# Patient Record
Sex: Male | Born: 1985 | Race: White | Hispanic: No | Marital: Married | Smoking: Never smoker
Health system: Southern US, Community
[De-identification: ages and names within clinical notes are randomized; demographics above are authoritative.]

---
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
## 2007-04-25 ENCOUNTER — Emergency Department (HOSPITAL_COMMUNITY): Admission: EM | Admit: 2007-04-25 | Discharge: 2007-04-25 | Payer: Self-pay | Admitting: Emergency Medicine

## 2011-04-29 NOTE — Op Note (Signed)
NAME:  RADIN, RAPTIS                       ACCOUNT NO.:  [id number]   MEDICAL RECORD NO.:  [id number]                   PATIENT TYPE:  OIB   LOCATION:  2853                                 FACILITY:  [facility]   PHYSICIAN:  Dionne Ano. Everlene Other, M.D.         DATE OF BIRTH:  [birth year]   DATE OF PROCEDURE:  07/22/2003  DATE OF DISCHARGE:                                 OPERATIVE REPORT   PREOPERATIVE DIAGNOSIS:  Displaced right finger proximal phalanx fracture  about the left hand.   POSTOPERATIVE DIAGNOSIS:  Displaced right finger proximal phalanx fracture  about the left hand.   PROCEDURE:  1. Closed reduction and pinning of proximal phalanx fracture of the left     ring finger.  2. Stress radiography,   SURGEON:  Dionne Ano. Amanda Pea, M.D.   ASSISTANT:   COMPLICATIONS:  None.   ANESTHESIA:  General .   TOURNIQUET TIME:  None.   ESTIMATED BLOOD LOSS:  Minimal.   SPECIMENS:  None.   DRAINS:  None.   INDICATIONS FOR PROCEDURE:  The patient is a very pleasant 25 year old male  who presents with the above mentioned diagnosis.  I have counseled him and  his family in regards to the risks and benefits of surgery including the  risks of infection, bleeding, anesthesia, damage to neural structures, and  failure of surgery to accomplish the intended.  With this in mind, they  desired to proceed.  All questions were encouraged and answered  preoperatively.   OPERATIVE FINDINGS:  The patient had displaced proximal phalanx fracture and  underwent closed reduction and pinning without difficulty.  Excellent  alignment under four planes was achieved interoperatively.  I was pleased  with this at the end of the procedure.   PROCEDURE IN DETAIL:  The patient was brought to the operating room,  underwent a smooth induction of general anesthesia, he was then laid supine  and appropriately padded, prepped and draped in the usual sterile fashion  about the left upper extremity with  Betadine scrub and paint.  Once this was  done, the patient had fluoroscopy brought in and a manipulative reduction  done of the proximal phalanx.  I held this reduction with clamps, checked  this in the AP, lateral and oblique planes, and lined this up nicely.  It  looked excellent and was satisfactory.  Following this, I then placed three  0.045 K-wires across the fracture site at right angles/90 degrees to the  fracture line.  This was done to my satisfaction without difficulty.  I then  performed stress radiography revealing excellent stability of the fracture.  I was pleased with this in the findings.  Following this, I bent the pin to  90 degrees, placed Xeroform, dressed the area sterilely.  He was placed in a  plaster splint and tolerated the procedure well.  He was awakened from  anesthesia and taken to the recovery room in good condition.  All sponge and  instrument counts were reported as correct.  We will monitor him closely and  keep the pins in for 3 1/2 to 4 weeks until fracture healing occurs, once  this is done, then we will plan for pin removal.  I have discussed this with  the patient and all questions were encouraged and answered.                                               Dionne Ano. Everlene Other, M.D.    Nash Mantis  D:  07/22/2003  T:  07/22/2003  Job:  [id number]

## 2012-08-08 ENCOUNTER — Other Ambulatory Visit: Payer: Self-pay | Admitting: Sports Medicine

## 2012-08-08 DIAGNOSIS — M25531 Pain in right wrist: Secondary | ICD-10-CM

## 2012-08-10 ENCOUNTER — Ambulatory Visit
Admission: RE | Admit: 2012-08-10 | Discharge: 2012-08-10 | Disposition: A | Discharge status: Home / Self Care | Payer: BC Managed Care – PPO | Source: Ambulatory Visit | Attending: Sports Medicine | Admitting: Sports Medicine

## 2012-08-10 DIAGNOSIS — M25531 Pain in right wrist: Secondary | ICD-10-CM

## 2013-03-08 ENCOUNTER — Other Ambulatory Visit: Payer: Self-pay | Admitting: Orthopedic Surgery

## 2013-03-08 DIAGNOSIS — S62001A Unspecified fracture of navicular [scaphoid] bone of right wrist, initial encounter for closed fracture: Secondary | ICD-10-CM

## 2013-03-13 ENCOUNTER — Ambulatory Visit
Admission: RE | Admit: 2013-03-13 | Discharge: 2013-03-13 | Disposition: A | Discharge status: Home / Self Care | Payer: PRIVATE HEALTH INSURANCE | Source: Ambulatory Visit | Attending: Orthopedic Surgery | Admitting: Orthopedic Surgery

## 2013-03-13 DIAGNOSIS — S62001A Unspecified fracture of navicular [scaphoid] bone of right wrist, initial encounter for closed fracture: Secondary | ICD-10-CM

## 2015-10-19 ENCOUNTER — Emergency Department (HOSPITAL_COMMUNITY)
Admission: EM | Admit: 2015-10-19 | Discharge: 2015-10-19 | Disposition: A | Discharge status: Home / Self Care | Payer: 59 | Source: Home / Self Care

## 2015-10-19 ENCOUNTER — Encounter (HOSPITAL_COMMUNITY): Payer: Self-pay | Admitting: Emergency Medicine

## 2015-10-19 DIAGNOSIS — L600 Ingrowing nail: Secondary | ICD-10-CM | POA: Diagnosis not present

## 2015-10-19 MED ORDER — CEPHALEXIN 500 MG PO CAPS: 500.0000 mg | ORAL_CAPSULE | Freq: Four times a day (QID) | ORAL | Status: AC

## 2015-10-19 MED ORDER — LIDOCAINE HCL 2 % IJ SOLN
INTRAMUSCULAR | Status: AC
  Filled 2015-10-19: qty 20

## 2015-10-19 MED ORDER — BACITRACIN 500 UNIT/GM EX OINT: 1.0000 "application " | TOPICAL_OINTMENT | Freq: Two times a day (BID) | CUTANEOUS | Status: AC

## 2015-10-19 NOTE — ED Notes (Signed)
Pt states he noticed pain in his right great toe about two weeks ago.  Since then the pain has increased.  He thinks he may have clipped his nail to low and now has an ingrown toenail.

## 2015-10-19 NOTE — ED Provider Notes (Signed)
CSN: [id number]     Arrival date & time 10/19/15  1800 History   None    Chief Complaint  Patient presents with  . Toe Pain   (Consider location/radiation/quality/duration/timing/severity/associated sxs/prior Treatment) The history is provided by the patient.    History reviewed. No pertinent past medical history. History reviewed. No pertinent past surgical history. History reviewed. No pertinent family history. Social History  Substance Use Topics  . Smoking status: Never Smoker   . Smokeless tobacco: None  . Alcohol Use: Yes     Comment: occasional    Review of Systems  Constitutional: Negative.   Eyes: Negative.   Respiratory: Negative.   Cardiovascular: Negative.   Gastrointestinal: Negative.   Endocrine: Negative.   Genitourinary: Negative.   Musculoskeletal: Negative.   Skin: Positive for wound.       Right lateral ingrown toenail  Allergic/Immunologic: Negative.   Neurological: Negative.   Hematological: Negative.   Psychiatric/Behavioral: Negative.     Allergies  Review of patient's allergies indicates no known allergies.  Home Medications   Prior to Admission medications   Medication Sig Start Date End Date Taking? Authorizing Provider  cephALEXin (KEFLEX) 500 MG capsule Take 1 capsule (500 mg total) by mouth 4 (four) times daily. 10/19/15   Deatra Canter, FNP   Meds Ordered and Administered this Visit  Medications - No data to display  BP 123/89 mmHg  Pulse 62  Temp(Src) 98 F (36.7 C) (Oral)  Resp 12  SpO2 100% No data found.   Physical Exam  Constitutional: He appears well-developed and well-nourished.  HENT:  Head: Normocephalic and atraumatic.  Eyes: Conjunctivae and EOM are normal. Pupils are equal, round, and reactive to light.  Neck: Normal range of motion. Neck supple.  Cardiovascular: Normal rate, regular rhythm and normal heart sounds.   Pulmonary/Chest: Effort normal and breath sounds normal.  Abdominal: Soft. Bowel sounds  are normal.  Skin: There is erythema.  Lateral edge of right first toenail ingrown and right toe erythematous and tender.    ED Course  .Nail Removal Date/Time: 10/19/2015 8:46 PM Performed by: Deatra Canter Authorized by: Deatra Canter Consent: Verbal consent obtained. Written consent not obtained. Risks and benefits: risks, benefits and alternatives were discussed Consent given by: patient Patient understanding: patient states understanding of the procedure being performed Patient consent: the patient's understanding of the procedure matches consent given Procedure consent: procedure consent matches procedure scheduled Relevant documents: relevant documents present and verified Test results: test results available and properly labeled Site marked: the operative site was marked Imaging studies: imaging studies available Patient identity confirmed: verbally with patient Location: right foot Anesthesia: digital block Local anesthetic: lidocaine 2% without epinephrine Anesthetic total: 8 ml Patient sedated: no Preparation: skin prepped with alcohol and skin prepped with Betadine Amount removed: 1/3 Wedge excision of skin of nail fold: no Nail bed sutured: no Nail matrix removed: partial Dressing: 4x4 Comments: Right great toe prepped with ETOH and betadine and then 4 cc's of lidocaine w/o epi 2% injected each side of toe for digital block. Once adequate anesthesia is obtained the lateral Part of the right toenail is removed and hemostasis is obtained with 4x4 dressing.     (including critical care time)  Labs Review Labs Reviewed - No data to display  Imaging Review No results found.   Visual Acuity Review  Right Eye Distance:   Left Eye Distance:   Bilateral Distance:    Right Eye Near:   Left Eye  Near:    Bilateral Near:         MDM   1. Ingrown toenail    Right first toenail lateral toenail removed (partial toenail removal) Ortho post op  shoe Get Neosporin ointment  Deatra CanterWilliam J Oxford FNP  Deatra CanterWilliam J Oxford, FNP 10/19/15 2050

## 2017-02-16 DIAGNOSIS — T1502XA Foreign body in cornea, left eye, initial encounter: Secondary | ICD-10-CM | POA: Diagnosis not present

## 2017-02-24 DIAGNOSIS — T1502XA Foreign body in cornea, left eye, initial encounter: Secondary | ICD-10-CM | POA: Diagnosis not present

## 2018-05-23 ENCOUNTER — Ambulatory Visit: Payer: Self-pay | Admitting: Family

## 2018-05-23 ENCOUNTER — Encounter: Payer: Self-pay | Admitting: Family

## 2018-05-23 VITALS — BP 120/70 | HR 69 | Temp 98.9°F | Ht 71.0 in | Wt 208.6 lb

## 2018-05-23 DIAGNOSIS — Z0000 Encounter for general adult medical examination without abnormal findings: Secondary | ICD-10-CM

## 2018-05-23 NOTE — Patient Instructions (Signed)
Exercising to Stay Healthy Exercising regularly is important. It has many health benefits, such as:  Improving your overall fitness, flexibility, and endurance.  Increasing your bone density.  Helping with weight control.  Decreasing your body fat.  Increasing your muscle strength.  Reducing stress and tension.  Improving your overall health.  In order to become healthy and stay healthy, it is recommended that you do moderate-intensity and vigorous-intensity exercise. You can tell that you are exercising at a moderate intensity if you have a higher heart rate and faster breathing, but you are still able to hold a conversation. You can tell that you are exercising at a vigorous intensity if you are breathing much harder and faster and cannot hold a conversation while exercising. How often should I exercise? Choose an activity that you enjoy and set realistic goals. Your health care provider can help you to make an activity plan that works for you. Exercise regularly as directed by your health care provider. This may include:  Doing resistance training twice each week, such as: ? Push-ups. ? Sit-ups. ? Lifting weights. ? Using resistance bands.  Doing a given intensity of exercise for a given amount of time. Choose from these options: ? 150 minutes of moderate-intensity exercise every week. ? 75 minutes of vigorous-intensity exercise every week. ? A mix of moderate-intensity and vigorous-intensity exercise every week.  Children, pregnant women, people who are out of shape, people who are overweight, and older adults may need to consult a health care provider for individual recommendations. If you have any sort of medical condition, be sure to consult your health care provider before starting a new exercise program. What are some exercise ideas? Some moderate-intensity exercise ideas include:  Walking at a rate of 1 mile in 15  minutes.  Biking.  Hiking.  Golfing.  Dancing.  Some vigorous-intensity exercise ideas include:  Walking at a rate of at least 4.5 miles per hour.  Jogging or running at a rate of 5 miles per hour.  Biking at a rate of at least 10 miles per hour.  Lap swimming.  Roller-skating or in-line skating.  Cross-country skiing.  Vigorous competitive sports, such as football, basketball, and soccer.  Jumping rope.  Aerobic dancing.  What are some everyday activities that can help me to get exercise?  Yard work, such as: ? Pushing a lawn mower. ? Raking and bagging leaves.  Washing and waxing your car.  Pushing a stroller.  Shoveling snow.  Gardening.  Washing windows or floors. How can I be more active in my day-to-day activities?  Use the stairs instead of the elevator.  Take a walk during your lunch break.  If you drive, park your car farther away from work or school.  If you take public transportation, get off one stop early and walk the rest of the way.  Make all of your phone calls while standing up and walking around.  Get up, stretch, and walk around every 30 minutes throughout the day. What guidelines should I follow while exercising?  Do not exercise so much that you hurt yourself, feel dizzy, or get very short of breath.  Consult your health care provider before starting a new exercise program.  Wear comfortable clothes and shoes with good support.  Drink plenty of water while you exercise to prevent dehydration or heat stroke. Body water is lost during exercise and must be replaced.  Work out until you breathe faster and your heart beats faster. This information is not   intended to replace advice given to you by your health care provider. Make sure you discuss any questions you have with your health care provider. Document Released: 12/31/2010 Document Revised: 05/05/2016 Document Reviewed: 05/01/2014 Elsevier Interactive Patient Education  2018  [employer]vier Inc[employer]  

## 2018-05-23 NOTE — Progress Notes (Signed)
Subjective:     Patient ID: Mitchell Hansen, male   DOB: 10-19-1986, 32 y.o.   MRN: [id number]  HPI 32 year old male is in today for CPX for insurance purposes. He is married. No health concerns. Declines labs  Review of Systems  All other systems reviewed and are negative.  No past medical history on file.  Social History   Socioeconomic History  . Marital status: Single    Spouse name: Not on file  . Number of children: Not on file  . Years of education: Not on file  . Highest education level: Not on file  Occupational History  . Not on file  Social Needs  . Financial resource strain: Not on file  . Food insecurity:    Worry: Not on file    Inability: Not on file  . Transportation needs:    Medical: Not on file    Non-medical: Not on file  Tobacco Use  . Smoking status: Never Smoker  . Smokeless tobacco: Never Used  Substance and Sexual Activity  . Alcohol use: Yes    Comment: occasional  . Drug use: No  . Sexual activity: Not on file  Lifestyle  . Physical activity:    Days per week: Not on file    Minutes per session: Not on file  . Stress: Not on file  Relationships  . Social connections:    Talks on phone: Not on file    Gets together: Not on file    Attends religious service: Not on file    Active member of club or organization: Not on file    Attends meetings of clubs or organizations: Not on file    Relationship status: Not on file  . Intimate partner violence:    Fear of current or ex partner: Not on file    Emotionally abused: Not on file    Physically abused: Not on file    Forced sexual activity: Not on file  Other Topics Concern  . Not on file  Social History Narrative  . Not on file    No past surgical history on file.  No family history on file.  No Known Allergies  Current Outpatient Medications on File Prior to Visit  Medication Sig Dispense Refill  . bacitracin 500 UNIT/GM ointment Apply 1 application topically 2 (two) times  daily. 28 g 1  . cephALEXin (KEFLEX) 500 MG capsule Take 1 capsule (500 mg total) by mouth 4 (four) times daily. 40 capsule 0   No current facility-administered medications on file prior to visit.     BP 120/70   Pulse 69   Temp 98.9 F (37.2 C)   Ht 5\' 11"  (1.803 m)   Wt 208 lb 9.6 oz (94.6 kg)   SpO2 98%   BMI 29.09 kg/m chart    Objective:   Physical Exam  Constitutional: He is oriented to person, place, and time. He appears well-developed and well-nourished.  HENT:  Head: Normocephalic and atraumatic.  Right Ear: External ear normal.  Left Ear: External ear normal.  Mouth/Throat: Oropharynx is clear and moist.  Eyes: Pupils are equal, round, and reactive to light. Conjunctivae and EOM are normal.  Neck: Normal range of motion. Neck supple.  Cardiovascular: Normal rate, regular rhythm and normal heart sounds.  Pulmonary/Chest: Effort normal and breath sounds normal.  Abdominal: Soft. Bowel sounds are normal.  Musculoskeletal: Normal range of motion.  Neurological: He is alert and oriented to person, place, and time.  Skin: Skin is warm and dry.  Psychiatric: He has a normal mood and affect.       Assessment:     Mitchell Hansen was seen today for cpe.  Diagnoses and all orders for this visit:  Routine health maintenance      Plan:     See PCP as scheduled

## 2018-10-28 ENCOUNTER — Emergency Department (HOSPITAL_COMMUNITY): Payer: 59

## 2018-10-28 ENCOUNTER — Encounter (HOSPITAL_COMMUNITY): Payer: Self-pay

## 2018-10-28 ENCOUNTER — Emergency Department (HOSPITAL_COMMUNITY)
Admission: EM | Admit: 2018-10-28 | Discharge: 2018-10-28 | Disposition: A | Discharge status: Home / Self Care | Payer: 59 | Attending: Emergency Medicine | Admitting: Emergency Medicine

## 2018-10-28 ENCOUNTER — Other Ambulatory Visit: Payer: Self-pay

## 2018-10-28 DIAGNOSIS — S93491A Sprain of other ligament of right ankle, initial encounter: Secondary | ICD-10-CM | POA: Diagnosis not present

## 2018-10-28 DIAGNOSIS — M25571 Pain in right ankle and joints of right foot: Secondary | ICD-10-CM | POA: Diagnosis not present

## 2018-10-28 DIAGNOSIS — W2189XA Striking against or struck by other sports equipment, initial encounter: Secondary | ICD-10-CM | POA: Diagnosis not present

## 2018-10-28 DIAGNOSIS — Y92320 Baseball field as the place of occurrence of the external cause: Secondary | ICD-10-CM | POA: Diagnosis not present

## 2018-10-28 DIAGNOSIS — Y992 Volunteer activity: Secondary | ICD-10-CM | POA: Insufficient documentation

## 2018-10-28 DIAGNOSIS — Y9364 Activity, baseball: Secondary | ICD-10-CM | POA: Insufficient documentation

## 2018-10-28 DIAGNOSIS — S99911A Unspecified injury of right ankle, initial encounter: Secondary | ICD-10-CM | POA: Diagnosis not present

## 2018-10-28 DIAGNOSIS — M7989 Other specified soft tissue disorders: Secondary | ICD-10-CM | POA: Diagnosis not present

## 2018-10-28 NOTE — ED Triage Notes (Signed)
Pt states while playing ball today he went into first base with his foot getting stuck on the base and turning under him.

## 2018-10-28 NOTE — ED Provider Notes (Signed)
[facility]ENC[facility] Provider Note   CSN: [id number] Arrival date & time: 10/28/18  1425     History   Chief Complaint Chief Complaint  Patient presents with  . Ankle Pain    HPI Mitchell Jarvisimothy R Stryker is a 32 y.o. male.  The history is provided by the patient. No language interpreter was used.  Ankle Pain     Mitchell Hansen is a 32 y.o. male who presents to the Emergency Department complaining of ankle pain. Resents to the emergency department complaining of ankle pain. He was playing softball when he was sliding into first base had an inversion injury to his right ankle. He experienced immediate pain and swelling. He placed ice and took ibuprofen and presents for evaluation. Pain is located over the lateral aspect of the right ankle. No prior similar symptoms. He has no medical problems and takes no medications. History reviewed. No pertinent past medical history.  There are no active problems to display for this patient.   History reviewed. No pertinent surgical history.      Home Medications    Prior to Admission medications   Medication Sig Start Date End Date Taking? Authorizing Provider  bacitracin 500 UNIT/GM ointment Apply 1 application topically 2 (two) times daily. 10/19/15   Deatra Canterxford, William J, FNP  cephALEXin (KEFLEX) 500 MG capsule Take 1 capsule (500 mg total) by mouth 4 (four) times daily. 10/19/15   Deatra Canterxford, William J, FNP    Family History No family history on file.  Social History Social History   Tobacco Use  . Smoking status: Never Smoker  . Smokeless tobacco: Never Used  Substance Use Topics  . Alcohol use: Yes    Comment: occasional  . Drug use: No     Allergies   Patient has no known allergies.   Review of Systems Review of Systems  All other systems reviewed and are negative.    Physical Exam Updated Vital Signs BP 126/86 (BP Location: Right Arm)   Pulse 80   Temp 98.4 F (36.9 C) (Oral)   Resp  17   Ht 5\' 11"  (1.803 m)   Wt 93 kg   SpO2 100%   BMI 28.59 kg/m   Physical Exam  Constitutional: He is oriented to person, place, and time. He appears well-developed and well-nourished. No distress.  HENT:  Head: Normocephalic and atraumatic.  Cardiovascular: Normal rate.  Pulmonary/Chest: Effort normal. No respiratory distress.  Musculoskeletal:  2+ DP pulses bilaterally. There is moderate edema to the right ankle, greatest over the lateral malleolus. There is no tenderness over the fibular head. There is mild tenderness over the right anterior and lateral ankle. Flexion extension intact ankle. Sensation intact throughout the foot.  Neurological: He is alert and oriented to person, place, and time.  Skin: Skin is warm and dry. Capillary refill takes less than 2 seconds.  Psychiatric: He has a normal mood and affect. His behavior is normal.  Nursing note and vitals reviewed.    ED Treatments / Results  Labs (all labs ordered are listed, but only abnormal results are displayed) Labs Reviewed - No data to display  EKG None  Radiology No results found.  Procedures Procedures (including critical care time)  Medications Ordered in ED Medications - No data to display   Initial Impression / Assessment and Plan / ED Course  I have reviewed the triage vital signs and the nursing notes.  Pertinent labs & imaging results that were available during  my care of the patient were reviewed by me and considered in my medical decision making (see chart for details).     Here for evaluation of ankle pain after an inversion injury that occurred just prior to ED arrival. He has significant edema on examination. Plain films are negative for acute fracture. Counseled patient on home care for ankle sprain. Discussed outpatient follow-up.  Final Clinical Impressions(s) / ED Diagnoses   Final diagnoses:  None    ED Discharge Orders    None       Tilden Fossa, MD 10/28/18  1558

## 2018-10-28 NOTE — ED Notes (Signed)
Pt transported to XR.  

## 2018-10-31 DIAGNOSIS — M79671 Pain in right foot: Secondary | ICD-10-CM | POA: Diagnosis not present

## 2018-10-31 DIAGNOSIS — M25571 Pain in right ankle and joints of right foot: Secondary | ICD-10-CM | POA: Diagnosis not present

## 2018-10-31 MED FILL — HYDROCODON-APAP 5-325: 5-325 | 5 days supply | Qty: 20 | Fill #0

## 2018-11-01 DIAGNOSIS — M25571 Pain in right ankle and joints of right foot: Secondary | ICD-10-CM | POA: Diagnosis not present

## 2018-11-06 DIAGNOSIS — M25571 Pain in right ankle and joints of right foot: Secondary | ICD-10-CM | POA: Diagnosis not present

## 2018-11-06 DIAGNOSIS — M79671 Pain in right foot: Secondary | ICD-10-CM | POA: Diagnosis not present

## 2018-11-13 DIAGNOSIS — S93314D Dislocation of tarsal joint of right foot, subsequent encounter: Secondary | ICD-10-CM | POA: Diagnosis not present

## 2018-11-13 DIAGNOSIS — M79671 Pain in right foot: Secondary | ICD-10-CM | POA: Diagnosis not present

## 2018-11-13 DIAGNOSIS — M25571 Pain in right ankle and joints of right foot: Secondary | ICD-10-CM | POA: Diagnosis not present

## 2018-12-03 DIAGNOSIS — S93314D Dislocation of tarsal joint of right foot, subsequent encounter: Secondary | ICD-10-CM | POA: Diagnosis not present

## 2018-12-11 ENCOUNTER — Ambulatory Visit: Payer: 59 | Attending: Sports Medicine | Admitting: Physical Therapy

## 2018-12-11 ENCOUNTER — Encounter: Payer: Self-pay | Admitting: Physical Therapy

## 2018-12-11 DIAGNOSIS — M25671 Stiffness of right ankle, not elsewhere classified: Secondary | ICD-10-CM | POA: Insufficient documentation

## 2018-12-11 DIAGNOSIS — M6281 Muscle weakness (generalized): Secondary | ICD-10-CM | POA: Diagnosis not present

## 2018-12-11 DIAGNOSIS — R2689 Other abnormalities of gait and mobility: Secondary | ICD-10-CM | POA: Diagnosis not present

## 2018-12-11 DIAGNOSIS — M25571 Pain in right ankle and joints of right foot: Secondary | ICD-10-CM | POA: Diagnosis not present

## 2018-12-11 NOTE — Patient Instructions (Addendum)
ROM: Inversion / Eversion   With left leg relaxed, gently turn ankle and foot in and out. Move through full range of motion. Avoid pain. Repeat _15___ times per set. Do __1-2__ sets per session. Do __3__ sessions per day.  http://orth.exer.us/36   Copyright  VHI. All rights reserved.  ROM: Plantar / Dorsiflexion   With left leg relaxed, gently flex and extend ankle. Move through full range of motion. Avoid pain. Repeat _15___ times per set. Do ___1-2_ sets per session. Do ____ sessions per day.  http://orth.exer.us/34   Copyright  VHI. All rights reserved.  Ankle Alphabet   Using left ankle and foot only, trace the letters of the alphabet. Perform A to Z. Repeat _3___ times per set. Do __1__ sets per session. Do _1-2___ sessions per day.  http://orth.exer.us/16   Copyright  VHI. All rights reserved.  Ankle Circles   Slowly rotate right foot and ankle clockwise then counterclockwise. Gradually increase range of motion. Avoid pain. Circle _15___ times each direction per set. Do __1-2__ sets per session. Do _3___ sessions per day.  http://orth.exer.us/30   Copyright  VHI. All rights reserved.  Gastroc / Heel Cord Stretch - Seated With Towel   Sit on floor, towel around ball of foot. Gently pull foot in toward body, stretching heel cord and calf. Hold for ___ seconds. Repeat on involved leg. Repeat ___ times. Do ___ times per day.   Forefoot Evertors   Place right foot flat on towel, knee pointed forward. Use forefoot and toes to push towel out to side. Do not allow heel or knee to move. Hold _3___ seconds. Repeat _3___ times. Do _1___ sessions per day. CAUTION: Repetitions should be slow and controlled.  Forefoot Invertors   Place right foot flat on towel, knee pointed forward. Use forefoot and toes to pull towel in toward center. Do not allow heel or knee to move. Hold __3__ seconds. Repeat _3___ times. Do __1__ sessions per day. CAUTION: Repetitions should  be slow and controlled.   TOES: Towel Bunching   With involved straight toes on towel, bend toes bunching up towel ___ times. Do ___ times per day.  Copyright  VHI. All rights reserved.    RICE Therapy for Routine Care of Injuries The routine care of many injuries includes rest, ice, compression, and elevation (RICE therapy). RICE therapy is often recommended for injuries to soft tissues, such as muscle strain, sprains, bruises, and overuse injuries. It can also be used for some bone injuries. Using RICE therapy can help to relieve pain and lessen swelling. Supplies needed:  Ice.  Plastic bag.  Towel.  Elastic bandage.  Pillow or pillows to raise (elevate) the injured body part. How to care for your injury with RICE therapy  Rest Rest your injury. This may help with the healing process. Rest usually involves limiting your normal activities and not using the injured part of your body. Generally, you can return to your normal activities when your health care provider says it is okay and you can do them without much discomfort. If you rest the injury too much, it may not heal as well. Some injuries heal better with early movement instead of resting for too long. Talk with your health care provider about how you should limit your activities and whether you should start range-of-motion exercises for your injury. Ice Ice your injury to lessen swelling and pain. Do not apply ice directly to your skin.  Put ice in a plastic bag.  Place a towel  between your skin and the bag.  Leave the ice on for 20 minutes, 2-3 times a day. Use ice on as many days as told by your health care provider. Compression Put pressure (compression) on your injured area to control swelling, give support, and help with discomfort. Compression may be done with an elastic bandage. If an elastic bandage has been applied, follow these general tips:  Use the bandage as directed by the maker of the bandage that you  are using.  Do notwrap the bandage too tightly. That may block (cut off) circulation in the arm or leg in the area below the bandage. ? If part of your body beyond the bandage becomes blue, numb, cold, swollen, or more painful, your bandage is probably too tight. If this occurs, remove your bandage and reapply it more loosely.  Remove and reapply the bandage every 3-4 hours or as told by your health care provider.  See your health care provider if the bandage seems to be making your problems worse rather than better. Elevation Elevate your injured area to lessen swelling and pain. If possible, elevate your injured area at or above the level of your heart or the center of your chest. Contact a health care provider if:  Your pain and swelling continue.  Your symptoms are getting worse rather than improving. Having these problems may mean that you need further evaluation or imaging tests, such as X-rays or an MRI. Sometimes, X-rays may not show a small broken bone (fracture) until days after the injury happened. Make a follow-up appointment with your health care provider. Ask your health care provider, or the department that is doing the imaging test, when your results will be ready. Get help right away if:  You have sudden severe pain at or below the area of your injury.  You have redness or increased swelling around your injury.  You have tingling or numbness at or below the area of your injury and it does not improve after you remove the elastic bandage. Summary  The routine care of many injuries includes rest, ice, compression, and elevation (RICE therapy). Using RICE therapy can help to relieve pain and lessen swelling.  RICE therapy is often recommended for injuries to soft tissues, such as muscle strain, sprains, bruises, and overuse injuries. It can also be used for some bone injuries.  Seek medical care if your pain and swelling continue or if your symptoms are getting worse  rather than improving. This information is not intended to replace advice given to you by your health care provider. Make sure you discuss any questions you have with your health care provider. Document Released: 03/12/2001 Document Revised: 08/18/2017 Document Reviewed: 08/18/2017 Elsevier Interactive Patient Education  2019 Elsevier Inc.  Mitchell Hansen, PT Certified Exercise Expert for the Aging Adult  12/11/18 11:13 AM Phone: [phone] Fax: [phone]

## 2018-12-11 NOTE — Therapy (Signed)
[facility] [address] [place], [place], [address] Phone: [phone]   Fax:  [phone]  Physical Therapy Evaluation  Patient Details  Name: Mitchell Hansen MRN: [id number] Date of Birth: [birth year] Referring Provider (PT): Rodolph Bong MD   Encounter Date: 12/11/2018  PT End of Session - 12/11/18 1131    Visit Number  1    Number of Visits  13    Date for PT Re-Evaluation  01/22/19    Authorization Type  [facility]    PT Start Time  1102    PT Stop Time  1145    PT Time Calculation (min)  43 min    Activity Tolerance  Patient tolerated treatment well    Behavior During Therapy  [facility] for tasks assessed/performed       History reviewed. No pertinent past medical history.  History reviewed. No pertinent surgical history.  There were no vitals filed for this visit.   Subjective Assessment - 12/11/18 1101    Subjective  I was involved in an accident playing a softball tournament and I inverted my ankle.  I dislocated my subtalar jt. says the MD . Pt reset himself on the field    How long can you sit comfortably?  unlimited     How long can you stand comfortably?  1 hour then pain    How long can you walk comfortably?  1 hour    Currently in Pain?  Yes    Pain Score  1    can get up to 3/10   Pain Orientation  Right    Pain Descriptors / Indicators  Aching;Tightness;Sore    Pain Type  Acute pain    Pain Onset  1 to 4 weeks ago    Pain Frequency  Intermittent    Aggravating Factors   walking on foot especially without boot         [facility] PT Assessment - 12/11/18 1124      Assessment   Medical Diagnosis  right sub talar dislocation with ligament tears    Referring Provider (PT)  Rodolph Bong MD    Onset Date/Surgical Date  10/28/18    Hand Dominance  Right    Next MD Visit  not scheduled    Prior Therapy  none      Precautions   Precautions  --   ankle    Precaution Comments  wearing walking boot for protection       Restrictions   Weight Bearing Restrictions  Yes    RLE Weight Bearing  Weight bearing as tolerated      Balance Screen   Has the patient fallen in the past 6 months  Yes    How many times?  --   only once at softball games   Has the patient had a decrease in activity level because of a fear of falling?   No    Is the patient reluctant to leave their home because of a fear of falling?   No      Home Nurse, mental health  Private residence    Living Arrangements  Spouse/significant other;Children    Home Access  Stairs to enter    Entrance Stairs-Number of Steps  2    Entrance Stairs-Rails  None    Home Layout  Two level      Prior Function   Level of Independence  Independent      Cognition   Overall Cognitive Status  Within Functional Limits for tasks assessed      Observation/Other Assessments   Focus on Therapeutic Outcomes (FOTO)   FOTO intake 41% limittaion 59% predicted 32%       Figure 8 Edema   Figure 8 - Right   61.0 cm    Figure 8 - Left   57.5      ROM / Strength   AROM / PROM / Strength  AROM;Strength      AROM   Overall AROM   Deficits    Right Ankle Dorsiflexion  -4    Right Ankle Plantar Flexion  40    Right Ankle Inversion  16   tightness/pain   Right Ankle Eversion  10    Left Ankle Dorsiflexion  12    Left Ankle Plantar Flexion  58    Left Ankle Inversion  31    Left Ankle Eversion  35      Strength   Overall Strength  Deficits    Right Knee Flexion  5/5    Right Knee Extension  5/5    Left Knee Flexion  5/5    Left Knee Extension  5/5    Right Ankle Dorsiflexion  3-/5    Right Ankle Plantar Flexion  3-/5    Right Ankle Inversion  3-/5    Right Ankle Eversion  3-/5    Left Ankle Dorsiflexion  5/5    Left Ankle Plantar Flexion  5/5    Left Ankle Inversion  5/5    Left Ankle Eversion  5/5      Palpation   Palpation comment  tenderness over anterior ankle. over ant, talofibular lig    moderate swelling      Ambulation/Gait   Gait Pattern  Step-to pattern    Ambulation Surface  Level    Gait Comments  Pt with wt on right foot /decreased stance time on left foot and limping with tennis shoe on.     Pt told to wear protective boot               Objective measurements completed on examination: See above findings.      [facility] Adult PT Treatment/Exercise - 12/11/18 1124      Self-Care   Self-Care  Other Self-Care Comments    Other Self-Care Comments   educated on PRICE and use of boot for protection      Vasopneumatic   Number Minutes Vasopneumatic   15 minutes    Vasopnuematic Location   Ankle   right   Vasopneumatic Pressure  Medium    Vasopneumatic Temperature   32 degrees      Ankle Exercises: Stretches   Other Stretch  DF towel stretch on right 30 sec x 3      Ankle Exercises: Supine   Other Supine Ankle Exercises  AROM in DF, EV, IN and PF , ankle circles and x 10 each    Other Supine Ankle Exercises  towel scrunch for IN and EV and toe flexion/PF as able with length of towel x 2 each to reinforce             PT Education - 12/11/18 1117    Education Details  POC Explanation  initial HEP and RICE/edema control    Person(s) Educated  Patient    Methods  Explanation;Demonstration;Tactile cues;Verbal cues;Handout    Comprehension  Verbalized understanding;Returned demonstration       PT Short Term Goals - 12/11/18 1123  PT SHORT TERM GOAL #1   Title  Pt will be independent  with initial HEP (12-25-18)    Time  2    Period  Weeks    Status  New    Target Date  12/25/18      PT SHORT TERM GOAL #2   Title  "Demonstrate and verbalize understanding of condition management including RICE, positioning, use of A.D., HEP to reduce pain and edema( 12-25-18    Time  2    Period  Weeks    Status  New    Target Date  12/25/18        PT Long Term Goals - 12/11/18 1138      PT LONG TERM GOAL #1   Title  "Pt will be independent with advanced HEP.     Time  6     Period  Weeks    Status  New    Target Date  01/22/19      PT LONG TERM GOAL #2   Title  "FOTO will improve from 59% limitation   to 32%    indicating improved functional mobility .     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    Time  6    Period  Weeks    Status  New    Target Date  01/22/19      PT LONG TERM GOAL #4   Title  Pt will be able to negotiate steps without exacerbating pain     Time  6    Period  Weeks    Status  New    Target Date  01/22/19      PT LONG TERM GOAL #5   Title  "Pt will improve her R ankle DF toat least 10 degrees  for a more functional and efficient gait pattern    Time  6    Period  Weeks    Status  New    Target Date  01/22/19             Plan - 12/11/18 1338    Clinical Impression Statement  32 yo male playing softball tournament and had inversion sprain/ and subtalar dislocation of right foot in which pt reset himself on the field. Pt with impairments in ambulation, edema, AROM and weakness in which skilled PT can address.  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  Pt was wearing super flexible tennis shoe and was told to use walking boot as advised by MD.  Pt would like to return to [employer] in order to to his job    Clinical Presentation  Stable    Clinical Decision Making  Low    Rehab Potential  Excellent    PT Frequency  2x / week    PT Duration  6 weeks    PT Treatment/Interventions  ADLs/Self Care Home Management;Cryotherapy;Electrical Stimulation;Iontophoresis 4mg /ml Dexamethasone;Moist Heat;Ultrasound;Contrast Bath;Therapeutic exercise;Therapeutic activities;Functional mobility training;Gait training;Stair training;Neuromuscular re-education;Patient/family education;Manual techniques;Passive range  of motion;Taping;Dry needling;Vasopneumatic Device;Joint Manipulations    PT Next Visit Plan  assess edema, Add T band strengthening as able    PT Home  Exercise Plan  AROM ankle, towel exericises    Consulted and Agree with Plan of Care  Patient       Patient will benefit from skilled therapeutic intervention in order to improve the following deficits and impairments:  Abnormal gait, Decreased activity tolerance, Decreased mobility, Decreased range of motion, Difficulty walking, Decreased strength, Increased edema, Impaired flexibility, Increased muscle spasms, Pain  Visit Diagnosis: Pain in right ankle and joints of right foot  Stiffness of right ankle, not elsewhere classified  Muscle weakness (generalized)  Other abnormalities of gait and mobility     Problem List There are no active problems to display for this patient.  Lawrie Beardsley, PT Certified Exercise Expert for the Aging Adult  12/11/18 1:43 PM Phone: [phone] Fax: [phone]  [facility] [address] [place], [place], [address] Phone: [phone]   Fax:  [phone]  Name: Isabell Jarvisimothy R Babic MRN: [id number] Date of Birth: July 25, 1986

## 2018-12-18 ENCOUNTER — Encounter: Payer: Self-pay | Admitting: Physical Therapy

## 2018-12-18 ENCOUNTER — Ambulatory Visit: Payer: 59 | Attending: Sports Medicine | Admitting: Physical Therapy

## 2018-12-18 DIAGNOSIS — R2689 Other abnormalities of gait and mobility: Secondary | ICD-10-CM | POA: Diagnosis not present

## 2018-12-18 DIAGNOSIS — M25571 Pain in right ankle and joints of right foot: Secondary | ICD-10-CM | POA: Diagnosis not present

## 2018-12-18 DIAGNOSIS — M25671 Stiffness of right ankle, not elsewhere classified: Secondary | ICD-10-CM | POA: Insufficient documentation

## 2018-12-18 DIAGNOSIS — M6281 Muscle weakness (generalized): Secondary | ICD-10-CM | POA: Diagnosis not present

## 2018-12-18 NOTE — Therapy (Signed)
[facility] [address] [place], [place], [address] Phone: [phone]   Fax:  [phone]  Physical Therapy Treatment  Patient Details  Name: Mitchell Jarvisimothy R Hansen MRN: [id number] Date of Birth: November 20, 1986 Referring Provider (PT): Rodolph BongKendall Adam MD   Encounter Date: 12/18/2018  PT End of Session - 12/18/18 1534    Visit Number  2    Number of Visits  13    PT Start Time  0300    PT Stop Time  0349    PT Time Calculation (min)  49 min    Activity Tolerance  Patient tolerated treatment well    Behavior During Therapy  [facility] for tasks assessed/performed       History reviewed. No pertinent past medical history.  History reviewed. No pertinent surgical history.  There were no vitals filed for this visit.  Subjective Assessment - 12/18/18 1503    Subjective  Pt has no complaints of pain while in the boot, but when doing exercises has pain in lateral and posterior aspect of right ankle.    Currently in Pain?  No/denies    Aggravating Factors   Exercise with boot off.         [facility] - 12/18/18 0001      Figure 8 Edema   Figure 8 - Right   60.5cm    59 cm after vaso                  [facility] Adult PT Treatment/Exercise - 12/18/18 0001      Vasopneumatic   Number Minutes Vasopneumatic   15 minutes    Vasopnuematic Location   Ankle    Vasopneumatic Pressure  Medium    Vasopneumatic Temperature   34      Ankle Exercises: Stretches   Other Stretch  DF towel stretch on right 30 sec x 3      Ankle Exercises: Seated   Towel Crunch Limitations  20 reps    Towel Inversion/Eversion  --   10 reps each side   Towel Inversion/Eversion Limitations  Red Theraband    Toe Raise  15 reps    BAPS Limitations  No weight x2 min    Other Seated Ankle Exercises  DF and PF with red theraband      Ankle Exercises: Supine   Other Supine Ankle Exercises  --    Other Supine Ankle Exercises  --   Added alternating toe  lifts.            PT Education - 12/18/18 1546    Education Details  HEP    Person(s) Educated  Patient    Methods  Explanation;Handout    Comprehension  Verbalized understanding;Returned demonstration       PT Short Term Goals - 12/11/18 1123      PT SHORT TERM GOAL #1   Title  Pt will be independent  with initial HEP (12-25-18)    Time  2    Period  Weeks    Status  New    Target Date  12/25/18      PT SHORT TERM GOAL #2   Title  "Demonstrate and verbalize understanding of condition management including RICE, positioning, use of A.D., HEP to reduce pain and edema( 12-25-18    Time  2    Period  Weeks    Status  New    Target Date  12/25/18        PT Long Term Goals -  12/11/18 1138      PT LONG TERM GOAL #1   Title  "Pt will be independent with advanced HEP.     Time  6    Period  Weeks    Status  New    Target Date  01/22/19      PT LONG TERM GOAL #2   Title  "FOTO will improve from 59% limitation   to 32%    indicating improved functional mobility .     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 Added red theraband to 4 way ankle exercises. Pt shows improvement with DF ROM and is able to flex past neutral. Pt stated that  vasocryotherapy improved edema during last treatment and was continued for this treatment to reduce edema. Circumferential measurements improved by 1.5 cm in comparison to last treatment. Therapist provided cues during AROM ankle for proper alignment.     PT Frequency  2x / week    PT Duration  6 weeks    PT Treatment/Interventions  ADLs/Self Care Home Management;Cryotherapy;Electrical Stimulation;Iontophoresis 4mg /ml Dexamethasone;Moist Heat;Ultrasound;Contrast Bath;Therapeutic exercise;Therapeutic activities;Functional mobility training;Gait training;Stair training;Neuromuscular re-education;Patient/family education;Manual techniques;Passive range of motion;Taping;Dry needling;Vasopneumatic Device;Joint Manipulations    PT Next Visit Plan  Therapist instructed pt to bring shoe to next visit to initiate standing exercises.     PT Home Exercise Plan  AROM ankle with TB, towel exericises, toe raises, ankle 4 way with red theraband    Consulted and Agree with Plan of Care  Patient       During this treatment session, the therapist was present, participating in and directing the treatment.  Patient will benefit from skilled therapeutic intervention in order to improve the following deficits and impairments:  Abnormal gait, Decreased activity tolerance, Decreased mobility, Decreased range of motion, Difficulty walking, Decreased strength, Increased edema, Impaired flexibility, Increased muscle spasms, Pain  Visit Diagnosis: Pain in right ankle and joints of right foot  Stiffness of right ankle, not elsewhere classified  Muscle weakness (generalized)  Other abnormalities of gait and mobility     Problem List There are no active problems to display for this patient.   Royetta Asal, SPTA 12/18/2018, 4:04 PM   Jannette Spanner, PTA 12/18/18 4:06 PM Phone: [phone] Fax:  [phone]  [facility] [address] [place], [place],  [address] Phone: [phone]   Fax:  [phone]  Name: Mitchell Hansen MRN: [id number] Date of Birth: [birth year]

## 2018-12-18 NOTE — Patient Instructions (Signed)
Pt given 4 way ankle with red theraband 2 sets of 20 reps 2 times per day

## 2018-12-20 ENCOUNTER — Encounter: Payer: Self-pay | Admitting: Physical Therapy

## 2018-12-20 ENCOUNTER — Ambulatory Visit: Payer: 59 | Admitting: Physical Therapy

## 2018-12-20 DIAGNOSIS — M25671 Stiffness of right ankle, not elsewhere classified: Secondary | ICD-10-CM

## 2018-12-20 DIAGNOSIS — R2689 Other abnormalities of gait and mobility: Secondary | ICD-10-CM

## 2018-12-20 DIAGNOSIS — M25571 Pain in right ankle and joints of right foot: Secondary | ICD-10-CM

## 2018-12-20 DIAGNOSIS — M6281 Muscle weakness (generalized): Secondary | ICD-10-CM

## 2018-12-20 NOTE — Therapy (Signed)
[facility] [address] [place], [place], [address] Phone: [phone]   Fax:  [phone]  Physical Therapy Treatment  Patient Details  Name: Mitchell Hansen MRN: [id number] Date of Birth: [birth year] Referring Provider (PT): Rodolph BongKendall Adam MD   Encounter Date: 12/20/2018  PT End of Session - 12/20/18 0932    Visit Number  3    Number of Visits  13    Date for PT Re-Evaluation  01/22/19    Authorization Type  UMR    PT Start Time  0930    PT Stop Time  1015    PT Time Calculation (min)  45 min       History reviewed. No pertinent past medical history.  History reviewed. No pertinent surgical history.  There were no vitals filed for this visit.      [facility] - 12/20/18 0001      AROM   Right Ankle Dorsiflexion  0    Right Ankle Plantar Flexion  52    Right Ankle Inversion  18    Right Ankle Eversion  14      Strength   Right Ankle Dorsiflexion  4+/5    Right Ankle Plantar Flexion  4+/5    Right Ankle Inversion  4+/5    Right Ankle Eversion  4+/5                   [facility] Adult PT Treatment/Exercise - 12/20/18 0001      Vasopneumatic   Number Minutes Vasopneumatic   15 minutes    Vasopnuematic Location   Ankle    Vasopneumatic Pressure  Medium    Vasopneumatic Temperature   34      Manual Therapy   Manual therapy comments  ankle PROM all planes       Ankle Exercises: Seated   Other Seated Ankle Exercises  4 way red band ankle x 15 each       Ankle Exercises: Standing   Heel Raises  10 reps   already doing at home   Other Standing Ankle Exercises  SLS and tendem stance       Ankle Exercises: Aerobic   Recumbent Bike  L2 x 5 minutes                PT Short Term Goals - 12/11/18 1123      PT SHORT TERM GOAL #1   Title  Pt will be independent  with initial HEP (12-25-18)    Time  2    Period  Weeks    Status  New    Target Date  12/25/18      PT SHORT TERM GOAL #2    Title  "Demonstrate and verbalize understanding of condition management including RICE, positioning, use of A.D., HEP to reduce pain and edema( 12-25-18    Time  2    Period  Weeks    Status  New    Target Date  12/25/18        PT Long Term Goals - 12/11/18 1138      PT LONG TERM GOAL #1   Title  "Pt will be independent with advanced HEP.     Time  6    Period  Weeks    Status  New    Target Date  01/22/19      PT LONG TERM GOAL #2   Title  "FOTO will improve from 59% limitation   to 32%  indicating improved functional mobility .     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 Began rec bike, SLS and heel raises. He has been walking in home without boot for weeks. He has also already been performing heel raises. Repeated Vaso for Edema. AROM improved all planes.     PT Next Visit Plan  assess tolerance to closed chain this visit. Add standing DF stretch    PT Home Exercise Plan  AROM ankle with TB, towel exericises, toe raises, ankle 4 way with red theraband, heel raises SLS vs tandem stance    Consulted and Agree with Plan of Care  Patient       Patient will benefit from skilled therapeutic intervention in order to  improve the following deficits and impairments:  Abnormal gait, Decreased activity tolerance, Decreased mobility, Decreased range of motion, Difficulty walking, Decreased strength, Increased edema, Impaired flexibility, Increased muscle spasms, Pain  Visit Diagnosis: Pain in right ankle and joints of right foot  Stiffness of right ankle, not elsewhere classified  Muscle weakness (generalized)  Other abnormalities of gait and mobility     Problem List There are no active problems to display for this patient.   Sherrie Mustache, [place] 12/20/2018, 12:36 PM  [facility] [address] [place], [place], [address] Phone: [phone]   Fax:  [phone]  Name: Mitchell Hansen MRN: [id number] Date of Birth: [birth year]

## 2018-12-25 ENCOUNTER — Ambulatory Visit: Payer: 59 | Admitting: Physical Therapy

## 2018-12-25 ENCOUNTER — Other Ambulatory Visit: Payer: Self-pay

## 2018-12-25 DIAGNOSIS — M6281 Muscle weakness (generalized): Secondary | ICD-10-CM | POA: Diagnosis not present

## 2018-12-25 DIAGNOSIS — M25671 Stiffness of right ankle, not elsewhere classified: Secondary | ICD-10-CM | POA: Diagnosis not present

## 2018-12-25 DIAGNOSIS — R2689 Other abnormalities of gait and mobility: Secondary | ICD-10-CM

## 2018-12-25 DIAGNOSIS — M25571 Pain in right ankle and joints of right foot: Secondary | ICD-10-CM

## 2018-12-25 NOTE — Patient Instructions (Addendum)
  Heel Raise: Bilateral (Standing)   Rise on balls of feet. Repeat _15 ___ times per set. Do _2___ sets per session. Do _1-2___ sessions per day.Try to do 30 at one time  Exercise B is adding tennis ball for posterior tibilalis strength.  Tennis ball in between ankles and heel raise holding ball 15 x 2  http://orth.[email or web address].us/38   Heel Raise: Standing   Toes on board, heels on floor, knees slightly bent, rise up on toes as high as possible. Do _2___ sets. Complete _15x 2___ repetitions.  http://st.exer.us/132    Heel Raise: Unilateral (Standing)   Balance on left foot, then rise on ball of foot. And do a ball toss while standing Repeat _15___ times per set. Do ___2_ sets per session. Do ___1-2_ sessions per day.  http://orth.[email or web address].us/40   FUNCTIONAL MOBILITY: Toe Walking   Walk forward on toes. _2__  X 80 feet, ___ sets per day, __7_ days per week Use assistive device.   DORSIFLEXION STRENGTHENING:    http://orth.exer.us/42   FUNCTIONAL MOBILITY: Heel Walking   Walk forward on heels. _2__ reps for 80 feet x 2 , ___ sets per day, __7_ days per week   Now using green theraband for exercises. Now using standing stretch on stool/ chair forward and to the side for extra stretch. May add therabnd above ankle for added posterior motion of tibia/leg bone.    Garen Lah, PT Certified Exercise Expert for the Aging Adult  12/25/18 12:11 PM Phone: [phone] Fax: [phone]

## 2018-12-25 NOTE — Therapy (Signed)
[facility] [address] [place], [place], [address] Phone: [phone]   Fax:  [phone]  Physical Therapy Treatment  Patient Details  Name: Mitchell Jarvisimothy R Winslow MRN: [id number] Date of Birth: [birth year] Referring Provider (PT): Rodolph BongKendall Adam MD   Encounter Date: 12/25/2018  PT End of Session - 12/25/18 1211    Visit Number  4    Number of Visits  13    Date for PT Re-Evaluation  01/22/19    Authorization Type  UMR    PT Start Time  1145    PT Stop Time  1245    PT Time Calculation (min)  60 min    Activity Tolerance  Patient tolerated treatment well    Behavior During Therapy  [facility] for tasks assessed/performed       No past medical history on file.  No past surgical history on file.  There were no vitals filed for this visit.  Subjective Assessment - 12/25/18 1151    Subjective  Pt has not complaints of pain just tightness     How long can you sit comfortably?  unlimited     Currently in Pain?  No/denies    Pain Score  1    minimal time with up for 1 hour   Pain Orientation  Right    Pain Descriptors / Indicators  Aching;Tightness;Sore    Pain Type  Acute pain    Pain Onset  More than a month ago   dec 23 cast off.  NOv 17th   Pain Frequency  Intermittent         [facility] PT Assessment - 12/25/18 1216      Figure 8 Edema   Figure 8 - Right   59 cm     AROM   Right Ankle Dorsiflexion  5    Right Ankle Plantar Flexion  49    Right Ankle Inversion  23   tightness/pain   Right Ankle Eversion  20                   [facility] Adult PT Treatment/Exercise - 12/25/18 1155      Vasopneumatic   Number Minutes Vasopneumatic   15 minutes    Vasopnuematic Location   Ankle    Vasopneumatic Pressure  Medium    Vasopneumatic Temperature   34      Manual Therapy   Manual Therapy  Joint mobilization    Joint Mobilization  Pt in prone with  posterior glide grade 3/4 tibia over talus. and side side glide with  distraction.        Ankle Exercises: Stretches   Gastroc Stretch  3 reps;30 seconds    on inclinie board   Other Stretch  great toe flexion stretch and extension.     Other Stretch  gastroc stretch on step bil 15 times with heel raise       Ankle Exercises: Standing   Rebounder  standing 10 x 2 with blue med ball toss    Heel Raises  10 reps;Both   then 18 at one time. (goal 30 at a time)   Heel Walk (Round Trip)  80 feet x 2    Toe Walk (Round Trip)  80 feet x 2    Other Standing Ankle Exercises  Single limb  20 x   step up on step x 15     Other Standing Ankle Exercises  blue med ball toss with SLS on right 10x 2  Ankle Exercises: Seated   Other Seated Ankle Exercises  4 way green band ankle x 15 each              PT Education - 12/25/18 1228    Education Details  added  standing ankle HEP 12-25-18     Person(s) Educated  Patient    Methods  Explanation;Demonstration;Tactile cues;Verbal cues;Handout    Comprehension  Verbalized understanding;Returned demonstration       PT Short Term Goals - 12/25/18 1241      PT SHORT TERM GOAL #1   Title  Pt will be independent  with initial HEP (12-25-18)    Time  2    Period  Weeks    Status  Achieved      PT SHORT TERM GOAL #2   Title  "Demonstrate and verbalize understanding of condition management including RICE, positioning, use of A.D., HEP to reduce pain and edema( 12-25-18    Baseline  Pt with figure 8 right ankle at 59.0 cm 12-25-18    Time  2    Period  Weeks    Status  Achieved        PT Long Term Goals - 12/25/18 1242      PT LONG TERM GOAL #1   Title  "Pt will be independent with advanced HEP.     Baseline  Pt began standing strengthening and closed chain    Time  6    Period  Weeks    Status  On-going      PT LONG TERM GOAL #2   Title  "FOTO will improve from 59% limitation   to 32%    indicating improved functional mobility .     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    Baseline  1/10 pain today but not working , just began closed chain today    Time  6    Period  Weeks    Status  On-going      PT LONG TERM GOAL #4   Title  Pt will be able to negotiate steps without exacerbating pain     Baseline  most of time in boot but some out of boot still with discomfort and tightness    Time  6    Period  Weeks    Status  On-going      PT LONG TERM GOAL #5   Title  "Pt will improve her R ankle DF toat least 10 degrees  for a more functional and efficient gait pattern    Baseline  5 degrees in right DF 12-25-18    Time  6    Period  Weeks    Status  On-going            Plan - 12/25/18 1229    Clinical Impression Statement  Pt with no complaint of pain , just tightness.  Pt given HEP for standing strength. Pt will see MD on 01-03-19.  Pt still wearing walking boot.  and not working.  All STG's achieved.  Figure 8 swelling of right ankle now at 59.cm ( reduced from eval)  Pt with 5 degrees of DF from -4 on eval. Pt making good  progress toward goals.    Rehab Potential  Excellent    PT Frequency  2x / week    PT Duration  6 weeks    PT Treatment/Interventions  ADLs/Self Care Home Management;Cryotherapy;Electrical Stimulation;Iontophoresis 4mg /ml Dexamethasone;Moist Heat;Ultrasound;Contrast Bath;Therapeutic exercise;Therapeutic activities;Functional mobility training;Gait training;Stair training;Neuromuscular re-education;Patient/family education;Manual techniques;Passive range of motion;Taping;Dry needling;Vasopneumatic Device;Joint Manipulations    PT Next Visit Plan  Progress closed chain exericses. Standing ankle strength last time.  can attempt hop next visit if no pain.     PT Home Exercise Plan  AROM ankle with TB, towel exericises, toe raises, ankle 4 way with red theraband, heel raises SLS vs tandem  stance  standing ankle strength, heel walk, single limb activities toe walking , gastroc stretching.     Consulted and Agree with Plan of Care  Patient       Patient will benefit from skilled therapeutic intervention in order to improve the following deficits and impairments:  Abnormal gait, Decreased activity tolerance, Decreased mobility, Decreased range of motion, Difficulty walking, Decreased strength, Increased edema, Impaired flexibility, Increased muscle spasms, Pain  Visit Diagnosis: Pain in right ankle and joints of right foot  Stiffness of right ankle, not elsewhere classified  Muscle weakness (generalized)  Other abnormalities of gait and mobility     Problem List There are no active problems to display for this patient.  Garen Lah, PT Certified Exercise Expert for the Aging Adult  12/25/18 12:48 PM Phone: [phone] Fax: [phone]  [facility] [address] [place], [place], [address] Phone: [phone]   Fax:  [phone]  Name: Mitchell Hansen MRN: [id number] Date of Birth: Aug 17, 1986

## 2018-12-27 ENCOUNTER — Encounter: Payer: Self-pay | Admitting: Physical Therapy

## 2018-12-27 ENCOUNTER — Ambulatory Visit: Payer: 59 | Admitting: Physical Therapy

## 2018-12-27 DIAGNOSIS — M25671 Stiffness of right ankle, not elsewhere classified: Secondary | ICD-10-CM

## 2018-12-27 DIAGNOSIS — M6281 Muscle weakness (generalized): Secondary | ICD-10-CM | POA: Diagnosis not present

## 2018-12-27 DIAGNOSIS — R2689 Other abnormalities of gait and mobility: Secondary | ICD-10-CM

## 2018-12-27 DIAGNOSIS — M25571 Pain in right ankle and joints of right foot: Secondary | ICD-10-CM

## 2018-12-27 NOTE — Therapy (Signed)
[facility] [address] [place], [place], [address] Phone: [phone]   Fax:  [phone]  Physical Therapy Treatment  Patient Details  Name: Isabell Jarvisimothy R Blok MRN: [id number] Date of Birth: 02-14-1986 Referring Provider (PT): Rodolph BongKendall Adam MD   Encounter Date: 12/27/2018  PT End of Session - 12/27/18 1018    Visit Number  5    Number of Visits  13    Date for PT Re-Evaluation  01/22/19    Authorization Type  UMR    PT Start Time  0935    PT Stop Time  1030    PT Time Calculation (min)  55 min    Activity Tolerance  Patient tolerated treatment well    Behavior During Therapy  [facility] for tasks assessed/performed       History reviewed. No pertinent past medical history.  History reviewed. No pertinent surgical history.  There were no vitals filed for this visit.  Subjective Assessment - 12/27/18 0936    Subjective  "I felt a little burning after last treatment but it wasn't too bad."    Currently in Pain?  No/denies                       [facility] Adult PT Treatment/Exercise - 12/27/18 0001      Neuro Re-ed    Neuro Re-ed Details   Rockerboard exercises   Forward/backward (knee extended/bent), side to side     Vasopneumatic   Number Minutes Vasopneumatic   15 minutes    Vasopnuematic Location   Ankle    Vasopneumatic Pressure  Medium    Vasopneumatic Temperature   34      Ankle Exercises: Standing   Rocker Board  --   in neuro-re ed   Rebounder  standing 10 x 2 with blue med ball toss    Heel Raises  Both;20 reps   then 18 at one time. (goal 30 at a time)   Heel Walk (Round Trip)  80 feet x 2    Toe Walk (Round Trip)  80 feet x 2    Other Standing Ankle Exercises  Single limb  20 x   step up on step x 15     Other Standing Ankle Exercises  blue med ball toss with SLS on right 10x 2        Ankle Exercises: Stretches   Gastroc Stretch  3 reps;30 seconds    on inclinie board   Other Stretch  great  toe flexion stretch and extension.     Other Stretch  gastroc stretch on step bil 15 times with heel raise       Ankle Exercises: Seated   Other Seated Ankle Exercises  4 way green band ankle x 20 each       Ankle Exercises: Plyometrics   Bilateral Jumping  3 sets   Forward, back and side to side; 10 each way            PT Education - 12/27/18 1027    Education Details  Wearing ace wrap around house d/t boot causing ankle stiffness.     Person(s) Educated  Patient    Methods  Explanation    Comprehension  Verbalized understanding       PT Short Term Goals - 12/25/18 1241      PT SHORT TERM GOAL #1   Title  Pt will be independent  with initial HEP (12-25-18)    Time  2    Period  Weeks    Status  Achieved      PT SHORT TERM GOAL #2   Title  "Demonstrate and verbalize understanding of condition management including RICE, positioning, use of A.D., HEP to reduce pain and edema( 12-25-18    Baseline  Pt with figure 8 right ankle at 59.0 cm 12-25-18    Time  2    Period  Weeks    Status  Achieved        PT Long Term Goals - 12/25/18 1242      PT LONG TERM GOAL #1   Title  "Pt will be independent with advanced HEP.     Baseline  Pt began standing strengthening and closed chain    Time  6    Period  Weeks    Status  On-going      PT LONG TERM GOAL #2   Title  "FOTO will improve from 59% limitation   to 32%    indicating improved functional mobility .     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    Baseline  1/10 pain today but not working , just began closed chain today    Time  6    Period  Weeks    Status  On-going      PT LONG TERM GOAL #4   Title  Pt will be able to negotiate steps without exacerbating pain     Baseline  most of time in boot but some out of boot still with discomfort  and tightness    Time  6    Period  Weeks    Status  On-going      PT LONG TERM GOAL #5   Title  "Pt will improve her R ankle DF toat least 10 degrees  for a more functional and efficient gait pattern    Baseline  5 degrees in right DF 12-25-18    Time  6    Period  Weeks    Status  On-going            Plan - 12/27/18 1018    Clinical Impression Statement  Pt has no c/o pain, but states he is tight. Continued exercises from last tx and added rockerboard exercises. Pt was able to perform rockerboard without assist after practice trial. Pt was able to perform bilateral hopping forward, back, and side to side without pain. Symmetrical foot return noted after hopping. Provided education on wearing ASO instead of boot because pt c/o ankle stiffness when wearing the boot prolonged. Pt reports decreased stiffness after tx. Pt is progressing towards goals.     Rehab Potential  Excellent    PT Frequency  2x / week    PT Duration  6 weeks    PT Treatment/Interventions  ADLs/Self Care Home Management;Cryotherapy;Electrical Stimulation;Iontophoresis 4mg /ml Dexamethasone;Moist Heat;Ultrasound;Contrast Bath;Therapeutic exercise;Therapeutic activities;Functional mobility training;Gait training;Stair training;Neuromuscular re-education;Patient/family education;Manual techniques;Passive range of motion;Taping;Dry needling;Vasopneumatic Device;Joint Manipulations    PT Next Visit Plan  Progress closed chain exericses. Standing ankle strength last time. Has ASO been okay?    PT Home Exercise Plan  AROM ankle with TB, towel exericises, toe raises, ankle 4 way with red  theraband, heel raises SLS vs tandem stance  standing ankle strength, heel walk, single limb activities toe walking , gastroc stretching.     Consulted and Agree with Plan of Care  Patient      During this treatment session, the therapist was present, participating in and directing the treatment.  Patient will benefit from skilled  therapeutic intervention in order to improve the following deficits and impairments:  Abnormal gait, Decreased activity tolerance, Decreased mobility, Decreased range of motion, Difficulty walking, Decreased strength, Increased edema, Impaired flexibility, Increased muscle spasms, Pain  Visit Diagnosis: Pain in right ankle and joints of right foot  Stiffness of right ankle, not elsewhere classified  Muscle weakness (generalized)  Other abnormalities of gait and mobility     Problem List There are no active problems to display for this patient.  Royetta Asal, SPTA  Jannette Spanner [place], [place] 12/27/2018, 12:24 PM  [facility] [address] [place], [place], [address] Phone: [phone]   Fax:  [phone]  Name: RODOLPHE RETTIG MRN: [id number] Date of Birth: 07/08/1986

## 2019-01-01 ENCOUNTER — Encounter: Payer: Self-pay | Admitting: Physical Therapy

## 2019-01-01 ENCOUNTER — Ambulatory Visit: Payer: 59 | Admitting: Physical Therapy

## 2019-01-01 DIAGNOSIS — R2689 Other abnormalities of gait and mobility: Secondary | ICD-10-CM | POA: Diagnosis not present

## 2019-01-01 DIAGNOSIS — M6281 Muscle weakness (generalized): Secondary | ICD-10-CM

## 2019-01-01 DIAGNOSIS — M25571 Pain in right ankle and joints of right foot: Secondary | ICD-10-CM

## 2019-01-01 DIAGNOSIS — M25671 Stiffness of right ankle, not elsewhere classified: Secondary | ICD-10-CM

## 2019-01-01 NOTE — Therapy (Signed)
[facility] [address] [place], [place], [address] Phone: [phone]   Fax:  [phone]  Physical Therapy Treatment  Patient Details  Name: Mitchell Jarvisimothy R Lavallie MRN: [id number] Date of Birth: 1986/10/22 Referring Provider (PT): Rodolph BongKendall Adam MD   Encounter Date: 01/01/2019  PT End of Session - 01/01/19 0946    Visit Number  6    Number of Visits  13    Date for PT Re-Evaluation  01/22/19    Authorization Type  [facility]    PT Start Time  0944    PT Stop Time  1030    PT Time Calculation (min)  46 min    Activity Tolerance  Patient tolerated treatment well    Behavior During Therapy  [facility] for tasks assessed/performed       History reviewed. No pertinent past medical history.  History reviewed. No pertinent surgical history.  There were no vitals filed for this visit.  Subjective Assessment - 01/01/19 0946    Currently in Pain?  No/denies    Pain Orientation  Right    Pain Descriptors / Indicators  Tightness   only when working on it.   Pain Type  Acute pain         [facility] PT Assessment - 01/01/19 0001      AROM   Right Ankle Dorsiflexion  6    Right Ankle Plantar Flexion  53                   [facility] Adult PT Treatment/Exercise - 01/01/19 0948      Neuro Re-ed    Neuro Re-ed Details   Rockerboard exercises   Forward/backward (knee extended/bent), side to side     Vasopneumatic   Number Minutes Vasopneumatic   15 minutes    Vasopnuematic Location   Ankle    Vasopneumatic Pressure  Medium    Vasopneumatic Temperature   34      Manual Therapy   Manual Therapy  Joint mobilization    Joint Mobilization  Pt in prone with  posterior glide grade 3/4 tibia over talus. and side side glide with distraction.        Ankle Exercises: Standing   Other Standing Ankle Exercises  single limb swithing 25 # KB with 12 LOB, hip hinge x 20 with dowel and VC and TC, DL with 10#30# KB    Other Standing Ankle Exercises  2 min  cone touching with bil hands with SLS on Right       Ankle Exercises: Plyometrics   Bilateral Jumping  3 sets   Forward, back and side to side; 10 each way   Plyometric Exercises  standing deadlift with 45# x 10               PT Short Term Goals - 12/25/18 1241      PT SHORT TERM GOAL #1   Title  Pt will be independent  with initial HEP (12-25-18)    Time  2    Period  Weeks    Status  Achieved      PT SHORT TERM GOAL #2   Title  "Demonstrate and verbalize understanding of condition management including RICE, positioning, use of A.D., HEP to reduce pain and edema( 12-25-18    Baseline  Pt with figure 8 right ankle at 59.0 cm 12-25-18    Time  2    Period  Weeks    Status  Achieved  PT Long Term Goals - 12/25/18 1242      PT LONG TERM GOAL #1   Title  "Pt will be independent with advanced HEP.     Baseline  Pt began standing strengthening and closed chain    Time  6    Period  Weeks    Status  On-going      PT LONG TERM GOAL #2   Title  "FOTO will improve from 59% limitation   to 32%    indicating improved functional mobility .     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    Baseline  1/10 pain today but not working , just began closed chain today    Time  6    Period  Weeks    Status  On-going      PT LONG TERM GOAL #4   Title  Pt will be able to negotiate steps without exacerbating pain     Baseline  most of time in boot but some out of boot still with discomfort and tightness    Time  6    Period  Weeks    Status  On-going      PT LONG TERM GOAL #5   Title  "Pt will improve her R ankle DF toat least 10 degrees  for a more functional and efficient gait pattern    Baseline  5 degrees in right DF 12-25-18    Time  6    Period  Weeks    Status  On-going             Plan - 01/01/19 1451    Clinical Impression Statement  Pt enters clinic 13 minutes late with no pain and wearing soft brace now.  pt is making good progress toward goals and returning to workl.  Pt needs to be able to carry 100# door for work and worked on carryin 30 # and 45 # weights in clinic today.  Pt will return to MD in next week or so to be cleared by MD.      Rehab Potential  Excellent    PT Frequency  2x / week    PT Duration  6 weeks    PT Treatment/Interventions  ADLs/Self Care Home Management;Cryotherapy;Electrical Stimulation;Iontophoresis 4mg /ml Dexamethasone;Moist Heat;Ultrasound;Contrast Bath;Therapeutic exercise;Therapeutic activities;Functional mobility training;Gait training;Stair training;Neuromuscular re-education;Patient/family education;Manual techniques;Passive range of motion;Taping;Dry needling;Vasopneumatic Device;Joint Manipulations    PT Next Visit Plan  Progress closed chain exericses. Standing ankle strength last time. ASO working well.  needs to work on proprioception and DF mobility in standing    PT Home Exercise Plan  AROM ankle with TB, towel exericises, toe raises, ankle 4 way with red theraband, heel raises SLS vs tandem stance  standing ankle strength, heel walk, single limb activities toe walking , gastroc stretching.        Patient will benefit from skilled therapeutic intervention in order to improve the following deficits and impairments:  Abnormal gait, Decreased activity tolerance, Decreased mobility, Decreased range of motion, Difficulty walking, Decreased strength, Increased edema, Impaired flexibility, Increased muscle spasms, Pain  Visit Diagnosis: Pain in right ankle and joints of right foot  Stiffness of right ankle, not elsewhere classified  Muscle weakness (generalized)  Other abnormalities of gait and mobility     Problem List There are no active problems to display for this patient.   Garen LahLawrie Taelar Gronewold, PT Certified  Exercise Expert for the Aging Adult  01/01/19 2:57 PM Phone: [phone] Fax: [phone]  [facility] [address] [place], [place], [address] Phone: [phone]   Fax:  [phone]  Name: Mitchell Hansen MRN: [id number] Date of Birth: 1986/06/04

## 2019-01-03 ENCOUNTER — Encounter: Payer: Self-pay | Admitting: Physical Therapy

## 2019-01-03 ENCOUNTER — Ambulatory Visit: Payer: 59 | Admitting: Physical Therapy

## 2019-01-03 DIAGNOSIS — M25571 Pain in right ankle and joints of right foot: Secondary | ICD-10-CM

## 2019-01-03 DIAGNOSIS — R2689 Other abnormalities of gait and mobility: Secondary | ICD-10-CM

## 2019-01-03 DIAGNOSIS — M6281 Muscle weakness (generalized): Secondary | ICD-10-CM | POA: Diagnosis not present

## 2019-01-03 DIAGNOSIS — M25671 Stiffness of right ankle, not elsewhere classified: Secondary | ICD-10-CM | POA: Diagnosis not present

## 2019-01-03 NOTE — Therapy (Addendum)
[place], [place], [address] Phone: [phone]   Fax:  [phone]  Physical Therapy Treatment/Discharge Note  Patient Details  Name: Mitchell Hansen MRN: [id number] Date of Birth: October 04, 1986 Referring Provider (PT): Vickki Hearing MD   Encounter Date: 01/03/2019  PT End of Session - 01/03/19 1015    Visit Number  7    Number of Visits  13    Date for PT Re-Evaluation  01/22/19    PT Start Time  0938    PT Stop Time  1035    PT Time Calculation (min)  57 min    Activity Tolerance  Patient tolerated treatment well    Behavior During Therapy  [facility] for tasks assessed/performed       History reviewed. No pertinent past medical history.  History reviewed. No pertinent surgical history.  There were no vitals filed for this visit.  Subjective Assessment - 01/03/19 0951    Subjective  I am using my brace all the time except when I am at home and I dont wear it the brace at home and I sometimes go barefoot    How long can you sit comfortably?  unlimited     How long can you stand comfortably?  4 hours    How long can you walk comfortably?  4 hours    Currently in Pain?  No/denies    Pain Score  0-No pain    Pain Orientation  Right    Pain Descriptors / Indicators  Tightness    Pain Type  Acute pain    Pain Onset  More than a month ago    Pain Frequency  Intermittent         [facility] PT Assessment - 01/03/19 0001      Observation/Other Assessments   Focus on Therapeutic Outcomes (FOTO)   FOTO intake 73 % limitation 27% predicted 32%      AROM   Right Ankle Dorsiflexion  10   after heavy lifting   Right Ankle Plantar Flexion  59    Right Ankle Inversion  35    Right Ankle Eversion  24                   [facility] Adult PT Treatment/Exercise - 01/03/19 0954      Therapeutic Activites    Therapeutic Activities  Lifting    Lifting  5      Neuro Re-ed    Neuro Re-ed Details    Rockerboard exercises   Forward/backward (knee extended/bent), side to side     Vasopneumatic   Number Minutes Vasopneumatic   15 minutes    Vasopnuematic Location   Ankle    Vasopneumatic Pressure  Medium    Vasopneumatic Temperature   34      Ankle Exercises: Aerobic   Stepper  level 5 4 minutes     no pain     Ankle Exercises: Standing   Heel Raises  Both;20 reps   then 18 at one time. (goal 30 at a time)   Heel Walk (Round Trip)  100 ft x 2    Toe Walk (Round Trip)  100 ft x 2    Other Standing Ankle Exercises  single limb swing with 25 # KB alternating land right, DL, 10 x 45#, DL x 10 65#, DL x 85 x 10 and DL x 5 100#    Other Standing Ankle Exercises  2 min cone touching with bil  hands with SLS on Right ,  goblet squat 10 x 2 45 #  Reverse DL with # 30 lb KB ; standing with  blue t band around ankle with lunges in different planes of movment for right and then left  ( 5 minutes)      Ankle Exercises: Stretches   Gastroc Stretch  3 reps;30 seconds    on inclinie board   Other Stretch  gastroc stretch on step bil 15 times with heel raise                PT Short Term Goals - 12/25/18 1241      PT SHORT TERM GOAL #1   Title  Pt will be independent  with initial HEP (12-25-18)    Time  2    Period  Weeks    Status  Achieved      PT SHORT TERM GOAL #2   Title  "Demonstrate and verbalize understanding of condition management including RICE, positioning, use of A.D., HEP to reduce pain and edema( 12-25-18    Baseline  Pt with figure 8 right ankle at 59.0 cm 12-25-18    Time  2    Period  Weeks    Status  Achieved        PT Long Term Goals - 01/03/19 1005      PT LONG TERM GOAL #1   Title  "Pt will be independent with advanced HEP.     Baseline  continuing with plyometrics and DL and squats with KB    Time  6    Period  Weeks    Status  On-going      PT LONG TERM GOAL #2   Title  "FOTO will improve from 59% limitation   to 32%    indicating improved  functional mobility .     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    Baseline  Pt is now walking at least 4 hours on feet     Time  6    Period  Weeks    Status  Achieved      PT LONG TERM GOAL #4   Title  Pt will be able to negotiate steps without exacerbating pain     Baseline  using ASO brace     Time  6    Period  Weeks    Status  Achieved      PT LONG TERM GOAL #5   Title  "Pt will improve his R ankle DF toat least 10 degrees  for a more functional and efficient gait pattern    Baseline  10 degrees in right DF 01-03-19 after RX and standing mobilization    Time  6    Period  Weeks    Status  Achieved            Plan - 01/03/19 1332    Clinical Impression Statement  Pt enters clinic with no pain and ready to lift weights to simulate work environment.  Pt able to lift up to 100# without pain in ankle today. FOTO improved to 27% limiation today and achieved goal. DF improved to 10 degrees. and utilizing exerciises without pain.  Will return to MD for check up and return to work.  Pt has acheived all goals except independence with advanced HEP including plyometrics  He has so far exceeded expectations    PT Frequency  2x / week    PT Duration  6 weeks    PT Treatment/Interventions  ADLs/Self Care Home Management;Cryotherapy;Electrical Stimulation;Iontophoresis 62m/ml Dexamethasone;Moist Heat;Ultrasound;Contrast Bath;Therapeutic exercise;Therapeutic activities;Functional mobility training;Gait training;Stair training;Neuromuscular re-education;Patient/family education;Manual techniques;Passive range of motion;Taping;Dry needling;Vasopneumatic Device;Joint Manipulations    PT Next Visit Plan  work on plyometrics advanced HEP for last 2 visits    PT Home Exercise Plan  AROM ankle with TB, towel  exericises, toe raises, ankle 4 way with red theraband, heel raises SLS vs tandem stance  standing ankle strength, heel walk, single limb activities toe walking , gastroc stretching.     Consulted and Agree with Plan of Care  Patient       Patient will benefit from skilled therapeutic intervention in order to improve the following deficits and impairments:  Abnormal gait, Decreased activity tolerance, Decreased mobility, Decreased range of motion, Difficulty walking, Decreased strength, Increased edema, Impaired flexibility, Increased muscle spasms, Pain  Visit Diagnosis: Pain in right ankle and joints of right foot  Stiffness of right ankle, not elsewhere classified  Muscle weakness (generalized)  Other abnormalities of gait and mobility     Problem List There are no active problems to display for this patient.   LVoncille Lo PT Certified Exercise Expert for the Aging Adult  01/03/19 1:56 PM Phone: [phone]: 3[facility] [address]Phone: [phone]  Fax:  [phone] Name: TJUSTYCE BABYMRN: [id number] of Birth: [birth year]  PHYSICAL THERAPY DISCHARGE SUMMARY  Visits from Start of Care: 7  Current functional level related to goals / functional outcomes: unknown   Remaining deficits: Last known , as above   Education / Equipment: HEP Plan: Patient agrees to discharge.  Patient goals were partially met. Patient is being discharged due to meeting the stated rehab goals.  ?????    Most goals achieved and pt released by MD.  Only goal not observed, LTG # 1 unmarked last visit but he was moving to plyometrics next visit. PT was returning to work.  LVoncille Lo PT Certified Exercise Expert for the Aging Adult  02/14/19 2:55 PM Phone: [phone]: [phone]

## 2019-01-04 DIAGNOSIS — M25571 Pain in right ankle and joints of right foot: Secondary | ICD-10-CM | POA: Diagnosis not present

## 2019-01-04 DIAGNOSIS — M79671 Pain in right foot: Secondary | ICD-10-CM | POA: Diagnosis not present

## 2019-01-04 DIAGNOSIS — S93314D Dislocation of tarsal joint of right foot, subsequent encounter: Secondary | ICD-10-CM | POA: Diagnosis not present

## 2019-01-08 ENCOUNTER — Encounter: Payer: 59 | Admitting: Physical Therapy

## 2019-01-10 ENCOUNTER — Encounter: Payer: 59 | Admitting: Physical Therapy

## 2019-01-15 ENCOUNTER — Ambulatory Visit: Payer: 59 | Admitting: Physical Therapy

## 2019-01-17 ENCOUNTER — Encounter: Payer: 59 | Admitting: Physical Therapy

## 2019-03-03 DIAGNOSIS — T1501XA Foreign body in cornea, right eye, initial encounter: Secondary | ICD-10-CM | POA: Diagnosis not present

## 2019-03-03 DIAGNOSIS — H11431 Conjunctival hyperemia, right eye: Secondary | ICD-10-CM | POA: Diagnosis not present

## 2019-03-03 DIAGNOSIS — H5711 Ocular pain, right eye: Secondary | ICD-10-CM | POA: Diagnosis not present

## 2019-03-03 DIAGNOSIS — H18891 Other specified disorders of cornea, right eye: Secondary | ICD-10-CM | POA: Diagnosis not present

## 2019-03-03 DIAGNOSIS — H18231 Secondary corneal edema, right eye: Secondary | ICD-10-CM | POA: Diagnosis not present

## 2019-03-07 DIAGNOSIS — H11431 Conjunctival hyperemia, right eye: Secondary | ICD-10-CM | POA: Diagnosis not present

## 2019-03-07 DIAGNOSIS — H18891 Other specified disorders of cornea, right eye: Secondary | ICD-10-CM | POA: Diagnosis not present

## 2019-03-07 DIAGNOSIS — H18231 Secondary corneal edema, right eye: Secondary | ICD-10-CM | POA: Diagnosis not present

## 2019-03-07 DIAGNOSIS — T1501XD Foreign body in cornea, right eye, subsequent encounter: Secondary | ICD-10-CM | POA: Diagnosis not present

## 2019-10-04 DIAGNOSIS — S62145A Nondisplaced fracture of body of hamate [unciform] bone, left wrist, initial encounter for closed fracture: Secondary | ICD-10-CM | POA: Diagnosis not present

## 2019-10-04 DIAGNOSIS — M79642 Pain in left hand: Secondary | ICD-10-CM | POA: Diagnosis not present

## 2019-10-09 DIAGNOSIS — M25511 Pain in right shoulder: Secondary | ICD-10-CM | POA: Diagnosis not present

## 2019-10-09 DIAGNOSIS — M542 Cervicalgia: Secondary | ICD-10-CM | POA: Diagnosis not present

## 2019-11-01 DIAGNOSIS — S62145A Nondisplaced fracture of body of hamate [unciform] bone, left wrist, initial encounter for closed fracture: Secondary | ICD-10-CM | POA: Diagnosis not present

## 2020-07-13 DIAGNOSIS — Z0000 Encounter for general adult medical examination without abnormal findings: Secondary | ICD-10-CM | POA: Diagnosis not present

## 2020-07-13 DIAGNOSIS — Z23 Encounter for immunization: Secondary | ICD-10-CM | POA: Diagnosis not present

## 2020-08-09 DIAGNOSIS — Z20828 Contact with and (suspected) exposure to other viral communicable diseases: Secondary | ICD-10-CM | POA: Diagnosis not present

## 2020-08-25 IMAGING — CR DG ANKLE COMPLETE 3+V*R*
3 series · 3 of 3 positions shown · non-contrast
Comparison: None.

CLINICAL DATA: 32-year-old male status post injury playing softball
and sliding into base. Pain and swelling.

EXAM:
RIGHT ANKLE - COMPLETE 3+ VIEW

[ankle ap]
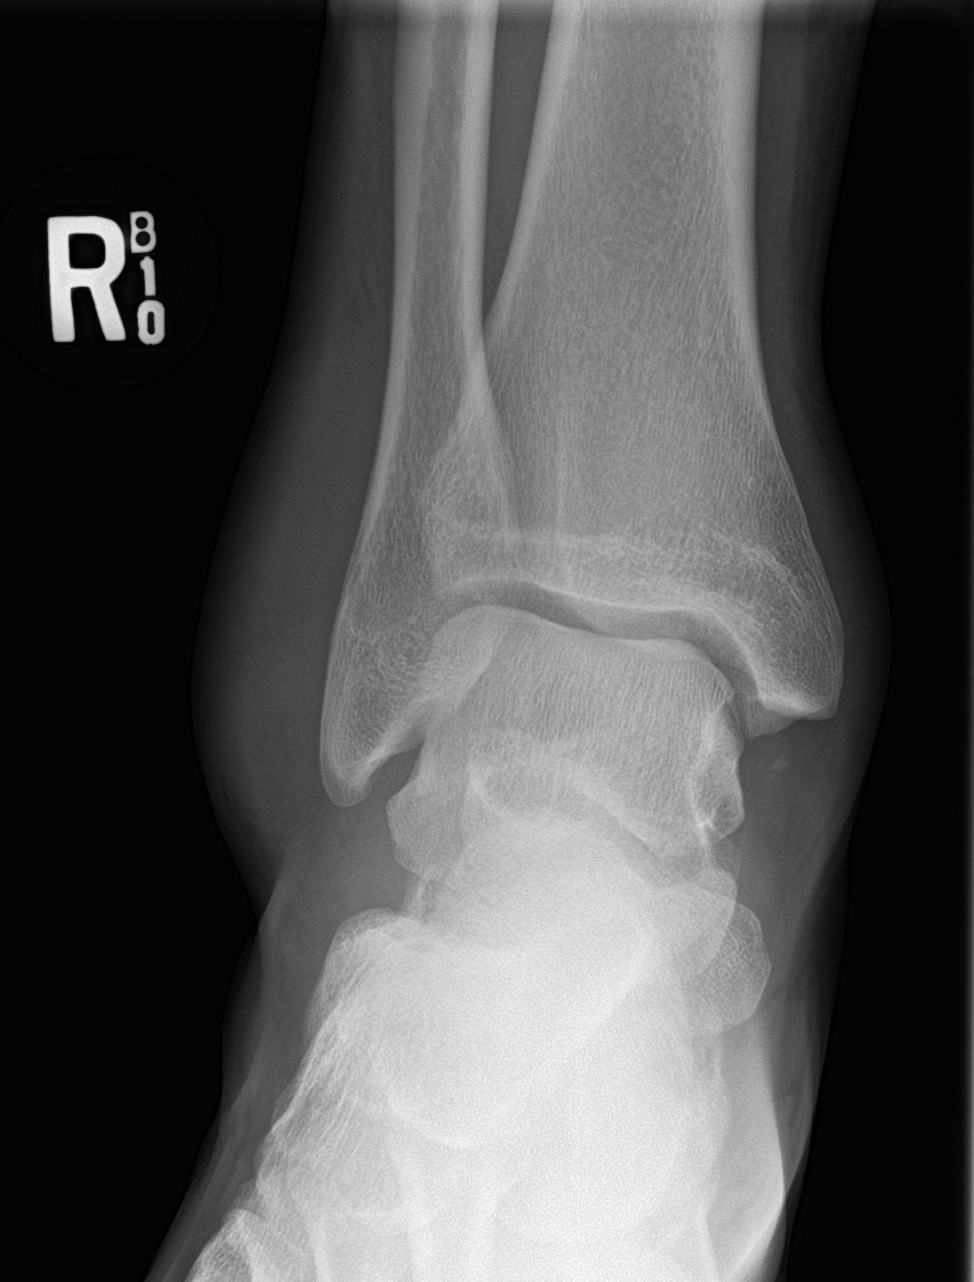

[ankle obl]
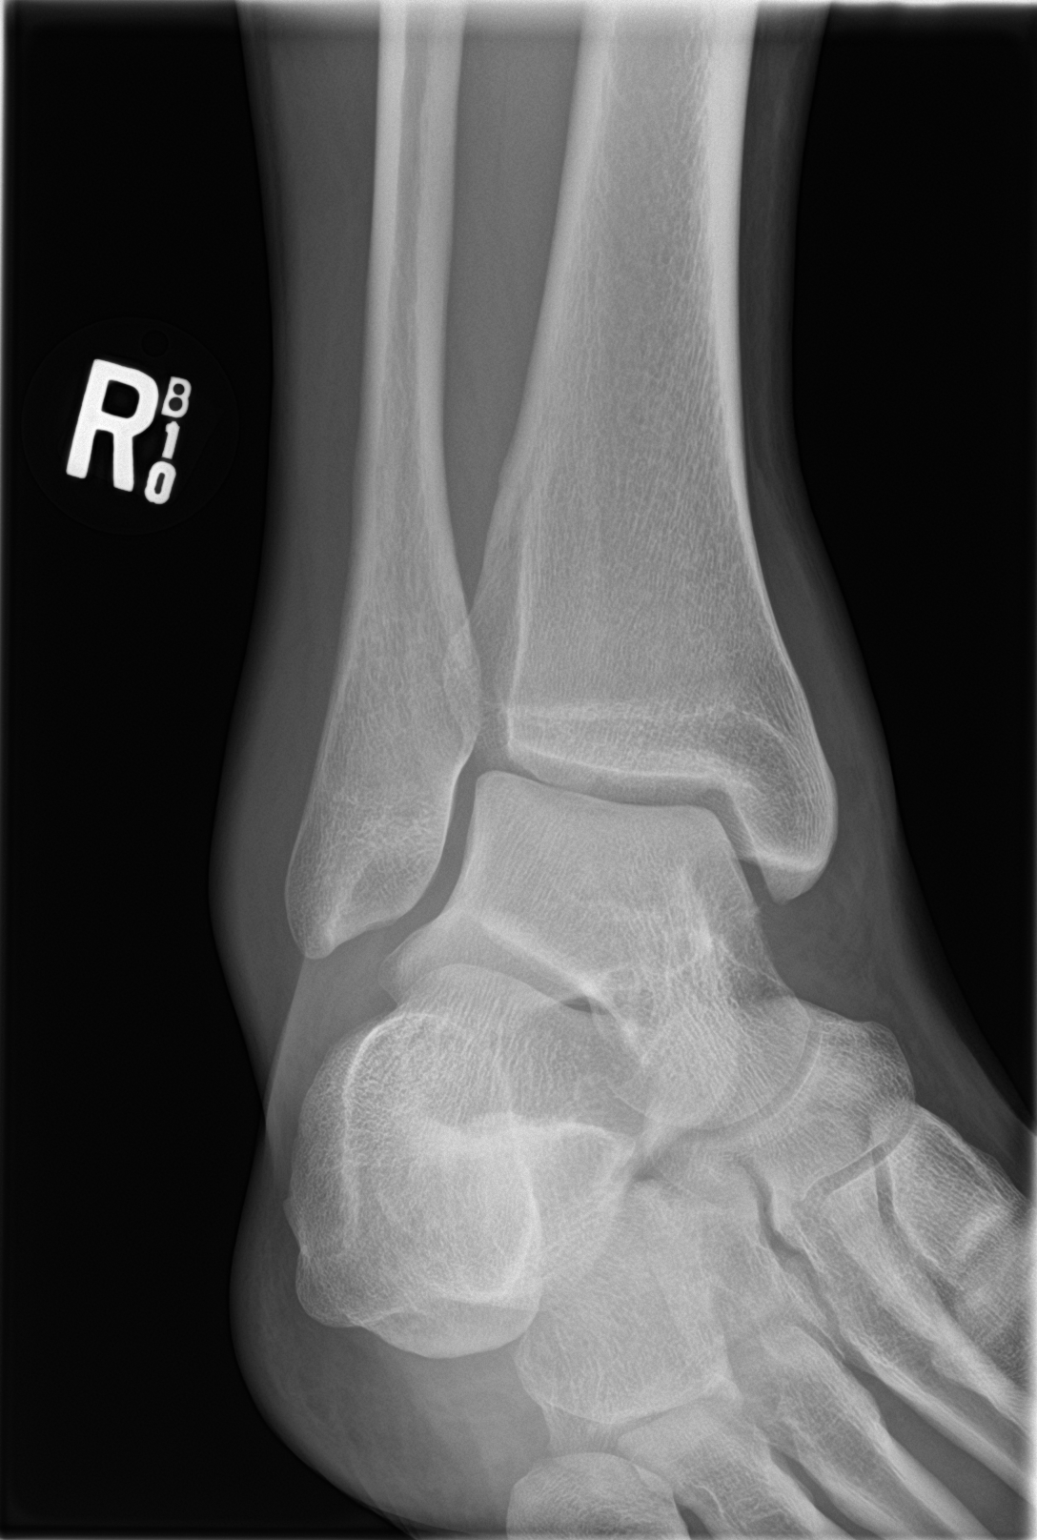

[ankle lat]
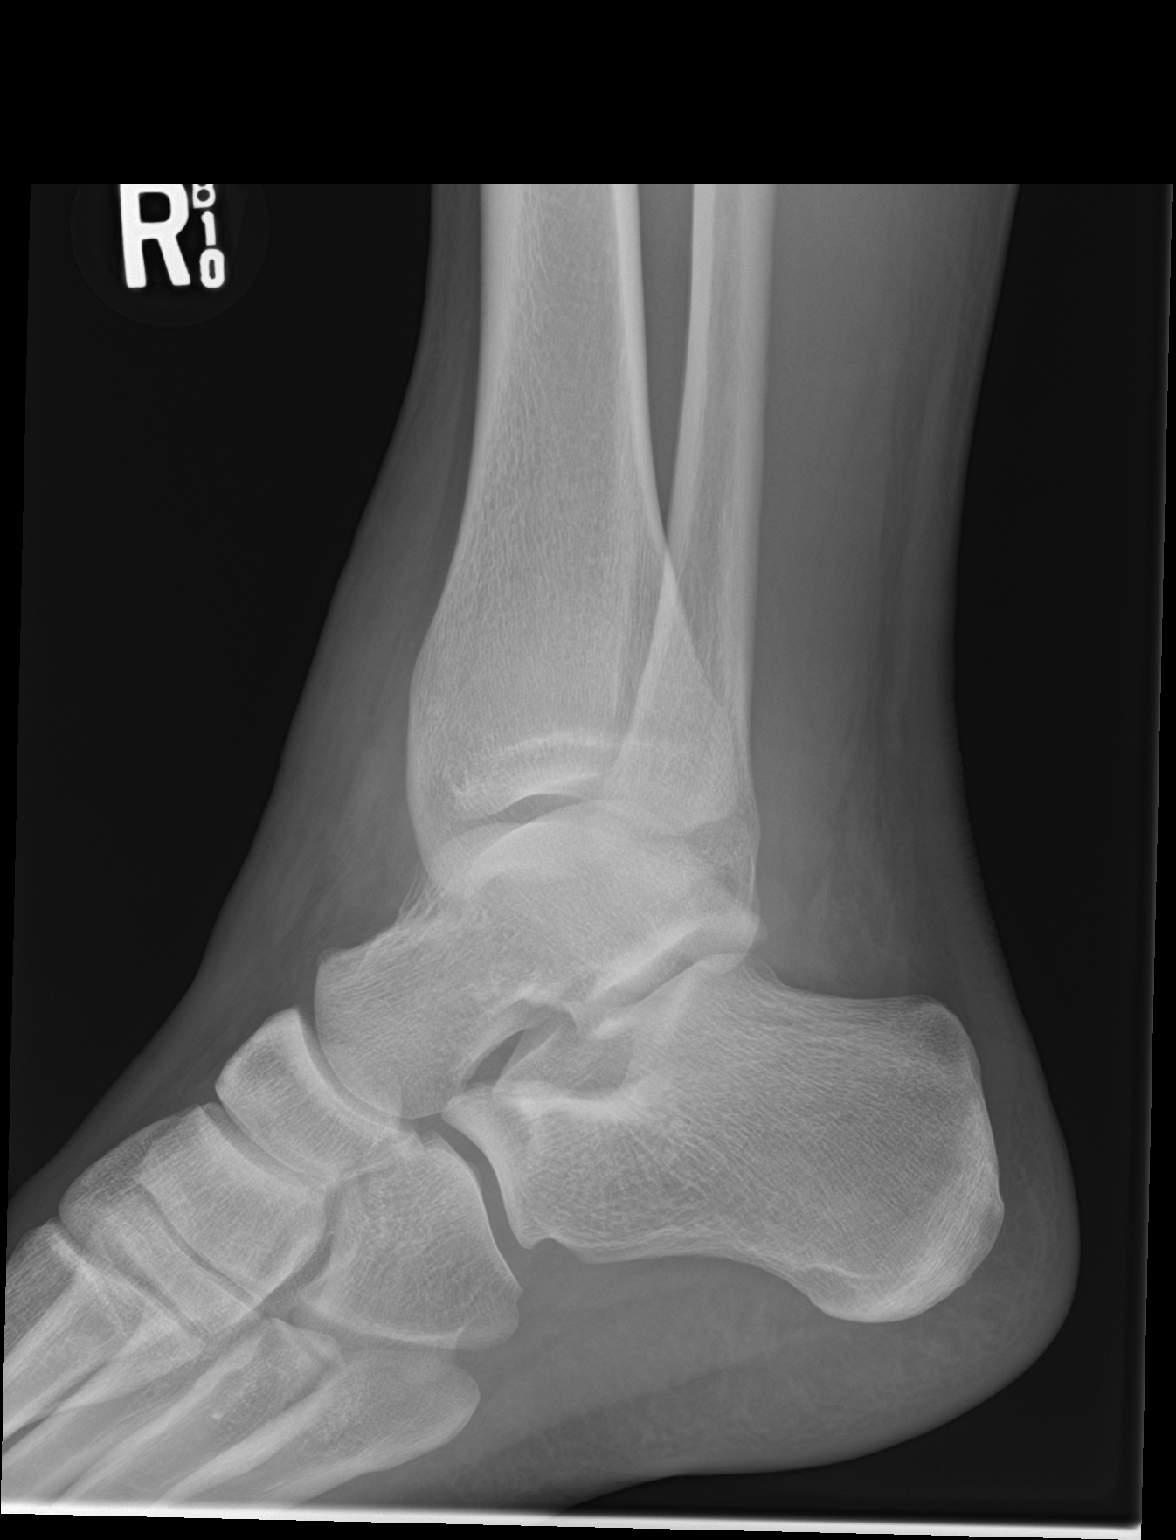

[3 of 3 positions shown; findings below may reference images not displayed]

FINDINGS: Bone mineralization is within normal limits. Preserved mortise joint
alignment. Talar dome intact. No fracture or dislocation identified.
There is no evidence of arthropathy or other focal bone abnormality.
Anterior and lateral soft tissue swelling. Questionable joint
effusion.
IMPRESSION: Soft tissue swelling but no acute fracture or dislocation identified
about the right ankle.

## 2021-07-17 DIAGNOSIS — M79642 Pain in left hand: Secondary | ICD-10-CM | POA: Diagnosis not present

## 2021-07-27 DIAGNOSIS — M79645 Pain in left finger(s): Secondary | ICD-10-CM | POA: Diagnosis not present

## 2023-11-16 ENCOUNTER — Ambulatory Visit
Admission: EM | Admit: 2023-11-16 | Discharge: 2023-11-16 | Disposition: A | Discharge status: Home / Self Care | Payer: BC Managed Care – PPO | Attending: Emergency Medicine | Admitting: Emergency Medicine

## 2023-11-16 DIAGNOSIS — J069 Acute upper respiratory infection, unspecified: Secondary | ICD-10-CM

## 2023-11-16 LAB — POCT RAPID STREP A (OFFICE): Rapid Strep A Screen: NEGATIVE

## 2023-11-16 LAB — POC COVID19/FLU A&B COMBO
Covid Antigen, POC: NEGATIVE
Influenza A Antigen, POC: NEGATIVE
Influenza B Antigen, POC: NEGATIVE

## 2023-11-16 NOTE — Discharge Instructions (Signed)
Strep, flu, and COVID tests negative. Continue OTC medicine as needed for symptoms, rest, and keep hydrated. Return to care if no improvement in a week. Go to the ER if develop difficulty breathing.

## 2023-11-16 NOTE — ED Provider Notes (Signed)
[facility]    CSN: [id number] Arrival date & time: 11/16/23  2952      History   Chief Complaint No chief complaint on file.   HPI Mitchell Hansen is a 37 y.o. male.   Patient presents with concerns of feeling unwell. He reports Monday he felt sick with headache, congestion, and cough. He felt better Tuesday but then yesterday (Wednesday) evening he developed worsening symptoms including a bad sore throat and back ache. He reports continued congestion, cough, and fatigue but reports the sore throat improved some. He denies known fever, n/v/d, or any difficulty breathing or history of asthma. He took Tylenol Cold and Sinus on Monday. He denies known sick contacts.   The history is provided by the patient.    History reviewed. No pertinent past medical history.  There are no problems to display for this patient.   History reviewed. No pertinent surgical history.     Home Medications    Prior to Admission medications   Medication Sig Start Date End Date Taking? Authorizing Provider  bacitracin 500 UNIT/GM ointment Apply 1 application topically 2 (two) times daily. 10/19/15   Deatra Canter, FNP  cephALEXin (KEFLEX) 500 MG capsule Take 1 capsule (500 mg total) by mouth 4 (four) times daily. 10/19/15   Deatra Canter, FNP    Family History History reviewed. No pertinent family history.  Social History Social History   Tobacco Use   Smoking status: Never   Smokeless tobacco: Never  Substance Use Topics   Alcohol use: Not Currently    Comment: occasional   Drug use: No     Allergies   Patient has no known allergies.   Review of Systems Review of Systems  Constitutional:  Positive for fatigue. Negative for fever.  HENT:  Positive for congestion and sore throat. Negative for ear pain.   Respiratory:  Positive for cough. Negative for shortness of breath and wheezing.   Gastrointestinal:  Negative for diarrhea, nausea and vomiting.   Musculoskeletal:  Positive for back pain. Negative for myalgias.  Skin:  Negative for rash.  Neurological:  Positive for headaches. Negative for dizziness.     Physical Exam Triage Vital Signs ED Triage Vitals  Encounter Vitals Group     BP 11/16/23 0850 117/78     Systolic BP Percentile --      Diastolic BP Percentile --      Pulse Rate 11/16/23 0850 86     Resp 11/16/23 0850 18     Temp 11/16/23 0850 98.7 F (37.1 C)     Temp Source 11/16/23 0850 Oral     SpO2 11/16/23 0850 99 %     Weight 11/16/23 0848 200 lb (90.7 kg)     Height 11/16/23 0848 5\' 11"  (1.803 m)     Head Circumference --      Peak Flow --      Pain Score 11/16/23 0848 5     Pain Loc --      Pain Education --      Exclude from Growth Chart --    No data found.  Updated Vital Signs BP 117/78 (BP Location: Left Arm)   Pulse 86   Temp 98.7 F (37.1 C) (Oral)   Resp 18   Ht 5\' 11"  (1.803 m)   Wt 200 lb (90.7 kg)   SpO2 99%   BMI 27.89 kg/m   Visual Acuity Right Eye Distance:   Left Eye Distance:   Bilateral  Distance:    Right Eye Near:   Left Eye Near:    Bilateral Near:     Physical Exam Vitals and nursing note reviewed.  Constitutional:      General: He is not in acute distress. HENT:     Head: Normocephalic.     Nose: Congestion present. No rhinorrhea.     Mouth/Throat:     Mouth: Mucous membranes are moist.     Pharynx: Oropharynx is clear. Posterior oropharyngeal erythema present. No oropharyngeal exudate.  Eyes:     Conjunctiva/sclera: Conjunctivae normal.     Pupils: Pupils are equal, round, and reactive to light.  Cardiovascular:     Rate and Rhythm: Normal rate and regular rhythm.     Heart sounds: Normal heart sounds.  Pulmonary:     Effort: Pulmonary effort is normal.     Breath sounds: Normal breath sounds.  Musculoskeletal:     Cervical back: Normal range of motion.  Lymphadenopathy:     Cervical: No cervical adenopathy.  Skin:    Findings: No rash.  Neurological:      Mental Status: He is alert.  Psychiatric:        Mood and Affect: Mood normal.      UC Treatments / Results  Labs (all labs ordered are listed, but only abnormal results are displayed) Labs Reviewed  POCT RAPID STREP A (OFFICE)  POC COVID19/FLU A&B COMBO    EKG   Radiology No results found.  Procedures Procedures (including critical care time)  Medications Ordered in UC Medications - No data to display  Initial Impression / Assessment and Plan / UC Course  I have reviewed the triage vital signs and the nursing notes.  Pertinent labs & imaging results that were available during my care of the patient were reviewed by me and considered in my medical decision making (see chart for details).     Neg strep, flu, COVID. Sx tx and reassurance.   E/M: 1 acute uncomplicated illness, 3 data, low risk   Final Clinical Impressions(s) / UC Diagnoses   Final diagnoses:  Viral URI     Discharge Instructions      Strep, flu, and COVID tests negative. Continue OTC medicine as needed for symptoms, rest, and keep hydrated. Return to care if no improvement in a week. Go to the ER if develop difficulty breathing.     ED Prescriptions   None    PDMP not reviewed this encounter.   Estanislado Pandy, [place] 11/16/23 [phone]

## 2023-11-16 NOTE — ED Triage Notes (Signed)
Patient presents with symptoms of tightness in throat, lower back pain, aching, cough and drainage x day 4. Treated Tylenol cold and sinus on Monday.
